# Patient Record
Sex: Female | Born: 1935 | Race: White | Hispanic: No | Smoking: Never smoker
Health system: Southern US, Community
[De-identification: ages and names within clinical notes are randomized; demographics above are authoritative.]

## PROBLEM LIST (undated history)

## (undated) DIAGNOSIS — S42309A Unspecified fracture of shaft of humerus, unspecified arm, initial encounter for closed fracture: Secondary | ICD-10-CM

## (undated) DIAGNOSIS — L729 Follicular cyst of the skin and subcutaneous tissue, unspecified: Secondary | ICD-10-CM

## (undated) DIAGNOSIS — D509 Iron deficiency anemia, unspecified: Secondary | ICD-10-CM

## (undated) DIAGNOSIS — E079 Disorder of thyroid, unspecified: Secondary | ICD-10-CM

## (undated) DIAGNOSIS — M810 Age-related osteoporosis without current pathological fracture: Secondary | ICD-10-CM

## (undated) DIAGNOSIS — E785 Hyperlipidemia, unspecified: Secondary | ICD-10-CM

## (undated) DIAGNOSIS — I350 Nonrheumatic aortic (valve) stenosis: Secondary | ICD-10-CM

## (undated) DIAGNOSIS — I639 Cerebral infarction, unspecified: Secondary | ICD-10-CM

## (undated) DIAGNOSIS — K5790 Diverticulosis of intestine, part unspecified, without perforation or abscess without bleeding: Secondary | ICD-10-CM

## (undated) DIAGNOSIS — G629 Polyneuropathy, unspecified: Secondary | ICD-10-CM

## (undated) DIAGNOSIS — S52539A Colles' fracture of unspecified radius, initial encounter for closed fracture: Secondary | ICD-10-CM

## (undated) DIAGNOSIS — D1800 Hemangioma unspecified site: Secondary | ICD-10-CM

## (undated) DIAGNOSIS — R55 Syncope and collapse: Secondary | ICD-10-CM

## (undated) DIAGNOSIS — M4800 Spinal stenosis, site unspecified: Secondary | ICD-10-CM

## (undated) HISTORY — DX: Cerebral infarction, unspecified: I63.9

## (undated) HISTORY — PX: APPENDECTOMY: SHX54

## (undated) HISTORY — DX: Spinal stenosis, site unspecified: M48.00

## (undated) HISTORY — PX: BREAST SURGERY: SHX581

## (undated) HISTORY — DX: Colles' fracture of unspecified radius, initial encounter for closed fracture: S52.539A

## (undated) HISTORY — DX: Unspecified fracture of shaft of humerus, unspecified arm, initial encounter for closed fracture: S42.309A

## (undated) HISTORY — PX: TOTAL ABDOMINAL HYSTERECTOMY: SHX209

## (undated) HISTORY — DX: Follicular cyst of the skin and subcutaneous tissue, unspecified: L72.9

## (undated) HISTORY — DX: Hemangioma unspecified site: D18.00

## (undated) HISTORY — DX: Nonrheumatic aortic (valve) stenosis: I35.0

## (undated) HISTORY — PX: LUMBAR SPINE SURGERY: SHX701

## (undated) HISTORY — DX: Diverticulosis of intestine, part unspecified, without perforation or abscess without bleeding: K57.90

## (undated) HISTORY — DX: Syncope and collapse: R55

## (undated) HISTORY — PX: CATARACT EXTRACTION: SUR2

## (undated) HISTORY — DX: Iron deficiency anemia, unspecified: D50.9

---

## 1969-09-11 HISTORY — PX: THYROIDECTOMY: SHX17

## 1973-09-11 DIAGNOSIS — I639 Cerebral infarction, unspecified: Secondary | ICD-10-CM

## 1973-09-11 HISTORY — DX: Cerebral infarction, unspecified: I63.9

## 1999-09-12 HISTORY — PX: COLONOSCOPY: SHX174

## 2001-07-23 ENCOUNTER — Ambulatory Visit (HOSPITAL_COMMUNITY): Admission: RE | Admit: 2001-07-23 | Discharge: 2001-07-23 | Payer: Self-pay | Admitting: Ophthalmology

## 2001-08-20 ENCOUNTER — Ambulatory Visit (HOSPITAL_COMMUNITY): Admission: RE | Admit: 2001-08-20 | Discharge: 2001-08-20 | Payer: Self-pay | Admitting: Ophthalmology

## 2001-11-27 ENCOUNTER — Ambulatory Visit (HOSPITAL_COMMUNITY): Admission: RE | Admit: 2001-11-27 | Discharge: 2001-11-27 | Payer: Self-pay | Admitting: Family Medicine

## 2002-08-23 ENCOUNTER — Emergency Department (HOSPITAL_COMMUNITY): Admission: EM | Admit: 2002-08-23 | Discharge: 2002-08-23 | Payer: Self-pay | Admitting: Emergency Medicine

## 2002-12-04 ENCOUNTER — Ambulatory Visit (HOSPITAL_COMMUNITY): Admission: RE | Admit: 2002-12-04 | Discharge: 2002-12-04 | Payer: Self-pay | Admitting: General Surgery

## 2002-12-04 ENCOUNTER — Encounter: Payer: Self-pay | Admitting: General Surgery

## 2003-10-13 ENCOUNTER — Inpatient Hospital Stay (HOSPITAL_COMMUNITY): Admission: EM | Admit: 2003-10-13 | Discharge: 2003-10-17 | Payer: Self-pay | Admitting: Emergency Medicine

## 2004-03-15 ENCOUNTER — Ambulatory Visit (HOSPITAL_COMMUNITY): Admission: RE | Admit: 2004-03-15 | Discharge: 2004-03-15 | Payer: Self-pay | Admitting: Family Medicine

## 2004-05-11 ENCOUNTER — Ambulatory Visit (HOSPITAL_COMMUNITY): Admission: RE | Admit: 2004-05-11 | Discharge: 2004-05-11 | Payer: Self-pay | Admitting: Family Medicine

## 2005-03-21 ENCOUNTER — Ambulatory Visit (HOSPITAL_COMMUNITY): Admission: RE | Admit: 2005-03-21 | Discharge: 2005-03-21 | Payer: Self-pay | Admitting: Family Medicine

## 2005-05-11 ENCOUNTER — Ambulatory Visit (HOSPITAL_COMMUNITY): Admission: RE | Admit: 2005-05-11 | Discharge: 2005-05-11 | Payer: Self-pay | Admitting: Family Medicine

## 2006-03-22 ENCOUNTER — Ambulatory Visit (HOSPITAL_COMMUNITY): Admission: RE | Admit: 2006-03-22 | Discharge: 2006-03-22 | Payer: Self-pay | Admitting: Family Medicine

## 2006-04-18 ENCOUNTER — Ambulatory Visit (HOSPITAL_COMMUNITY): Admission: RE | Admit: 2006-04-18 | Discharge: 2006-04-18 | Payer: Self-pay | Admitting: Family Medicine

## 2006-08-22 ENCOUNTER — Ambulatory Visit (HOSPITAL_COMMUNITY): Admission: RE | Admit: 2006-08-22 | Discharge: 2006-08-22 | Payer: Self-pay | Admitting: Family Medicine

## 2006-10-19 ENCOUNTER — Ambulatory Visit (HOSPITAL_COMMUNITY): Admission: RE | Admit: 2006-10-19 | Discharge: 2006-10-19 | Payer: Self-pay | Admitting: Family Medicine

## 2006-12-07 ENCOUNTER — Ambulatory Visit (HOSPITAL_COMMUNITY): Admission: RE | Admit: 2006-12-07 | Discharge: 2006-12-07 | Payer: Self-pay | Admitting: Family Medicine

## 2007-02-07 ENCOUNTER — Inpatient Hospital Stay (HOSPITAL_COMMUNITY): Admission: RE | Admit: 2007-02-07 | Discharge: 2007-02-09 | Payer: Self-pay | Admitting: Neurological Surgery

## 2007-06-13 ENCOUNTER — Ambulatory Visit (HOSPITAL_COMMUNITY): Admission: RE | Admit: 2007-06-13 | Discharge: 2007-06-13 | Payer: Self-pay | Admitting: Family Medicine

## 2007-08-05 ENCOUNTER — Ambulatory Visit (HOSPITAL_COMMUNITY): Admission: RE | Admit: 2007-08-05 | Discharge: 2007-08-05 | Payer: Self-pay | Admitting: Family Medicine

## 2009-09-29 ENCOUNTER — Encounter: Payer: Self-pay | Admitting: Orthopedic Surgery

## 2009-09-29 ENCOUNTER — Emergency Department (HOSPITAL_COMMUNITY): Admission: EM | Admit: 2009-09-29 | Discharge: 2009-09-29 | Payer: Self-pay | Admitting: Emergency Medicine

## 2009-09-30 ENCOUNTER — Ambulatory Visit: Payer: Self-pay | Admitting: Orthopedic Surgery

## 2009-09-30 ENCOUNTER — Telehealth: Payer: Self-pay | Admitting: Orthopedic Surgery

## 2009-09-30 DIAGNOSIS — I639 Cerebral infarction, unspecified: Secondary | ICD-10-CM | POA: Insufficient documentation

## 2009-09-30 DIAGNOSIS — S42209A Unspecified fracture of upper end of unspecified humerus, initial encounter for closed fracture: Secondary | ICD-10-CM | POA: Insufficient documentation

## 2009-10-04 ENCOUNTER — Ambulatory Visit (HOSPITAL_COMMUNITY): Admission: RE | Admit: 2009-10-04 | Discharge: 2009-10-04 | Payer: Self-pay | Admitting: Orthopedic Surgery

## 2009-10-04 ENCOUNTER — Telehealth: Payer: Self-pay | Admitting: Orthopedic Surgery

## 2009-10-05 ENCOUNTER — Ambulatory Visit: Payer: Self-pay | Admitting: Orthopedic Surgery

## 2009-10-05 ENCOUNTER — Telehealth: Payer: Self-pay | Admitting: Orthopedic Surgery

## 2009-10-06 ENCOUNTER — Telehealth: Payer: Self-pay | Admitting: Orthopedic Surgery

## 2009-10-20 ENCOUNTER — Telehealth: Payer: Self-pay | Admitting: Orthopedic Surgery

## 2009-10-21 ENCOUNTER — Ambulatory Visit: Payer: Self-pay | Admitting: Orthopedic Surgery

## 2009-10-21 DIAGNOSIS — S52539A Colles' fracture of unspecified radius, initial encounter for closed fracture: Secondary | ICD-10-CM | POA: Insufficient documentation

## 2009-10-25 ENCOUNTER — Encounter (HOSPITAL_COMMUNITY): Admission: RE | Admit: 2009-10-25 | Discharge: 2009-11-24 | Payer: Self-pay | Admitting: Orthopedic Surgery

## 2009-10-25 ENCOUNTER — Encounter: Payer: Self-pay | Admitting: Orthopedic Surgery

## 2009-11-01 ENCOUNTER — Encounter: Payer: Self-pay | Admitting: Orthopedic Surgery

## 2009-11-10 ENCOUNTER — Ambulatory Visit: Payer: Self-pay | Admitting: Orthopedic Surgery

## 2009-11-12 ENCOUNTER — Encounter: Payer: Self-pay | Admitting: Orthopedic Surgery

## 2009-11-15 ENCOUNTER — Encounter: Payer: Self-pay | Admitting: Orthopedic Surgery

## 2009-11-17 ENCOUNTER — Encounter: Payer: Self-pay | Admitting: Orthopedic Surgery

## 2009-11-24 ENCOUNTER — Encounter: Payer: Self-pay | Admitting: Orthopedic Surgery

## 2009-11-25 ENCOUNTER — Encounter (HOSPITAL_COMMUNITY): Admission: RE | Admit: 2009-11-25 | Discharge: 2009-12-25 | Payer: Self-pay | Admitting: Orthopedic Surgery

## 2009-12-15 ENCOUNTER — Encounter: Payer: Self-pay | Admitting: Orthopedic Surgery

## 2009-12-17 ENCOUNTER — Ambulatory Visit (HOSPITAL_COMMUNITY)
Admission: RE | Admit: 2009-12-17 | Discharge: 2009-12-17 | Payer: Self-pay | Source: Home / Self Care | Admitting: Family Medicine

## 2009-12-22 ENCOUNTER — Ambulatory Visit: Payer: Self-pay | Admitting: Orthopedic Surgery

## 2010-04-07 ENCOUNTER — Ambulatory Visit (HOSPITAL_COMMUNITY): Admission: RE | Admit: 2010-04-07 | Discharge: 2010-04-07 | Payer: Self-pay | Admitting: Family Medicine

## 2010-10-13 NOTE — Assessment & Plan Note (Signed)
Summary: ct results from ap/frs   Visit Type:  Follow-up Referring Provider:  ER  CC:  right shoulder fracture and right wrist fracture.Marland Kitchen  History of Present Illness: HISTORY   I saw Jaclyn Simpson in the office today for an initial visit.  She is a 77 years old woman with the complaint of:  RIGHT WRIST AND RIGHT SHOULDER pain   On 09-29-09 Fell at home walking up her steps, landed on her arm. She now c/o moderate sharp non radiating pain in the right wrist and severe pain in the right shoulder.   She did try some hydrocodone but it made her sleepy and now she is on tramadol.  ROS: She falls alot, she does not like using a walker, uses walking stick sometimes but still falls.  -xrays done & where: [facility] on [facility]-09. Surgical neck fracture extending into greater tuberosity of right shoulder. Transverse minimally displaced distal right radial metaphyseal fracture with soft tissue swelling.  TODAY:  STILL HAVING PAIN   MEDS: TRAMADOL OR VICODIN   PHYSICAL EXAM NEURO-VASCULAR INTACT   Assessment:  CT SHOWS  Findings:  There is a complex, comminuted fracture involving the right humeral head and neck.  There is a displaced avulsion type injury involving part of the greater tuberosity.  The surgical neck fracture begins at the base of the greater tuberosity and extends obliquely and inferiorly through the humeral neck.  The humeral head is maintained in the glenoid fossa.  The glenoid is intact. There are mild AC joint generative changes.  No scapular or upper rib fractures are seen.  The lesser tuberosity is intact.     Allergies: No Known Drug Allergies   Impression & Recommendations:  Problem # 1:  FRACTURE, HUMERUS, PROXIMAL (ICD-812.00) Assessment Comment Only  The x-rays were done at [facility]. The report and the films have been reviewed.  I agree with the report   I feel that the risk benefit ratio favors non surgical treatment   Orders: Humeral Neck Fx  (16109) Distal Radius Fx (25600)  Medications Added to Medication List This Visit: 1)  Norco 5-325 Mg Tabs (Hydrocodone-acetaminophen) .Marland Kitchen.. 1 by mouth q 4 as needed pain  Patient Instructions: 1)  xrays right shoulder in 2 weeks  Prescriptions: NORCO 5-325 MG TABS (HYDROCODONE-ACETAMINOPHEN) 1 by mouth q 4 as needed pain  #84 x 4   Entered and Authorized by:   Fuller Canada MD   Signed by:   Fuller Canada MD on 10/05/2009   Method used:   Print then Give to Patient   RxID:   [id number]   Appended Document: ct results from ap/frs applied [facility] right wrist

## 2010-10-13 NOTE — Miscellaneous (Signed)
Summary: Rehab Report  Rehab Report   Imported By: Elvera Maria 11/26/2009 11:09:07  _____________________________________________________________________  External Attachment:    Type:   Image     Comment:   pt eval

## 2010-10-13 NOTE — Miscellaneous (Signed)
Summary: Rehab Report  Rehab Report   Imported By: Elvera Maria 11/16/2009 11:45:39  _____________________________________________________________________  External Attachment:    Type:   Image     Comment:   clinic eval ot

## 2010-10-13 NOTE — Assessment & Plan Note (Signed)
Summary: 6 wk reckrt shoulder/wrist/medicare/bankers/bsf   Visit Type:  Follow-up Referring Provider:  ER Primary Provider:  Dr. Milinda Cave  CC:  recheck rt shoulder and wrist.  History of Present Illness: proximal humerus fracture, RIGHT wrist fracture. Both fractures have healed. She went for balance assessment and therapy and did well. She is ambulating with a cane. She can use her RIGHT upper extremity normally.  Forward elevation, RIGHT shoulder 150, passive pronation is full in the form of a RIGHT supination is about 50 full range of motion is noted in the RIGHT elbow. There is no tenderness proximal humerus or distal radius.   followup for RIGHT shoulder and RIGHT wrist fracture January  Medications are Norco 5 mg  she is discharged      Allergies: No Known Drug Allergies   Other Orders: Post-Op Check (59563)  Patient Instructions: 1)  Please schedule a follow-up appointment as needed.

## 2010-10-13 NOTE — Progress Notes (Signed)
Summary: right thumb is swollen  Phone Note Other Incoming   Summary of Call: Sandra/[facility] called about Jaclyn Simpson (2017/06/12)  [place] Spaniel that her right thumb is swollen she can wiggle her fingers and thumb.   There is no swelling above the cast.  She has good capillary refill. She has an appointment to see you tomorrow (10/21/09), but the nurse wanted you to be aware of the swelling. Sandra's # [phone] Initial call taken by: Jacklynn Ganong,  October 20, 2009 11:54 AM

## 2010-10-13 NOTE — Progress Notes (Signed)
Summary: CT appontment scheduled 10/04/09  Phone Note Outgoing Call   Call placed to: Patient Summary of Call: Per Luster Landsberg, patient's appointment for CT scan is scheduled at [facility] for today, 10/04/09, 2:45 registration, 3:15 appointment. Patient's contact, niece, notified ph [phone].  Patient to follow up in our office for results; appointment scheduled. Initial call taken by: Cammie Sickle,  October 04, 2009 2:32 PM

## 2010-10-13 NOTE — Progress Notes (Signed)
Summary: wants order for recliner  Phone Note Other Incoming   Summary of Call: Jaclyn Simpson, Jaclyn Paule' (14-Mar-2017)  neice called to say that Jaclyn Simpson cannot sleep in the bed because she cannot get herself in and out of the bed.  Wants to know if you will write  an order for her to get a remote control recliner to sleep in.  If so, wants the order faxed to [facility] and to call her at [phone] to let her know Initial call taken by: Jacklynn Ganong,  October 05, 2009 2:39 PM  Follow-up for Phone Call        ok  Follow-up by: Fuller Canada MD,  October 05, 2009 2:52 PM  Additional Follow-up for Phone Call Additional follow up Details #1::        Advised of reply and routed to [facility] to write the order and fax to [facility] Additional Follow-up by: Jacklynn Ganong,  October 05, 2009 3:41 PM    New/Updated Medications: * REMOTE CONTROL RECLINER  Prescriptions: REMOTE CONTROL RECLINER   #1 x 0   Entered by:   Ether Griffins   Authorized by:   Fuller Canada MD   Signed by:   Ether Griffins on 10/05/2009   Method used:   Faxed to ...       [facility]* (retail)       [address]       [place], [place]  [address]       Ph: [phone]       Fax: [phone]   RxID:   [id number]

## 2010-10-13 NOTE — Assessment & Plan Note (Signed)
Summary: 2 WK RE-CK/XRAYS RT SHOULDER/FX CARE/MEDICARE/CAF   Visit Type:  Follow-up Referring Provider:  ER Primary Provider:  Dr. Milinda Cave  CC:  frx care shoudler.  History of Present Illness: I saw Jaclyn Simpson in the office today for a followup visit.  She is a 77 years old woman with the complaint of:  DOI: 09/29/09 fell, right shoulder and right wrist fractures.  DAY 22  TREATMENT: Sling right shoulder and right wrist cast.  cast was applied 10/05/09  MEDS: Norco 5.  Has swelling of the feet and arms, is on fluid pills, Dr did not increase her fluid pills when she saw him 10/18/09 her swelling has increased, advised pt and family to call PCP.  Scheduled for:  recheck with xrays right shoulder.  Complaints:  shoulder still aches, wrist does not hurt.    Allergies: No Known Drug Allergies   Impression & Recommendations:  Problem # 1:  FRACTURE, HUMERUS, PROXIMAL (ICD-812.00)  XRAYS: AP lateral, RIGHT shoulder. There is a proximal comminuted fracture of the humerus with apex and posterior angulation however, the fracture fragments are in continuity with less than 45 of angulation. A mini fragment and less than a centimeter of displacement of any fragment.  Impression fracture appears to be healing in stable configuration.  Recommend start physical therapy.  Orders: Post-Op Check [phone]) Shoulder x-ray,  minimum 2 views (60454)  Problem # 2:  COLLES' FRACTURE, RIGHT WRIST (ICD-813.41)  she also has a fracture of her RIGHT wrist has a nondisplaced distal radius fracture. Her RIGHT wrist and hand are swollen side decided to remove the cast and place her in a cockup wrist splint,  He'll have an x-ray at the 6 week mark.  Orders: Post-Op Check (09811)  Patient Instructions: 1)  START PT RIGHT SHOULDER  2)  CAST OFF WRIST AND XRAYS IN 20 DAYS

## 2010-10-13 NOTE — Miscellaneous (Signed)
Summary: Rehabilitation referral form  Rehabilitation referral form   Imported By: Jacklynn Ganong 11/16/2009 09:46:49  _____________________________________________________________________  External Attachment:    Type:   Image     Comment:   External Document

## 2010-10-13 NOTE — Assessment & Plan Note (Signed)
Summary: RE-CK/XRAYS/CAF   Visit Type:  Follow-up Referring Provider:  ER Primary Provider:  Dr. Milinda Cave  CC:  recheck shoulder fx.  History of Present Illness:  followup for RIGHT shoulder and RIGHT wrist fracture January  Patient did well with her shoulder and is undergoing physical therapy complains of some mild elbow pain she is here today for x-rays out of plaster for the RIGHT wrist  Medications are Norco 5 mg  She is ambulatory with a cane  Her wrist has normal alignment  No tenderness  Recommend continued physical therapy.  The physical therapist is advised she needs to balance assessment and we have approved that      Allergies: No Known Drug Allergies   Patient Instructions: 1)  The sling can come off now 2)  Continue therapy for the shoulder with a balance program 3)  come back in 6 weeksw  Appended Document: RE-CK/XRAYS/CAF

## 2010-10-13 NOTE — Progress Notes (Signed)
Summary: Home care order  Phone Note Outgoing Call   Call placed to: Patient Summary of Call: Spoke with patient's niece, POA, Windy Fast, re: request for ADLs as discussed at today's office visit. Faxed order to [facility] and advised patient's niece may not be covered by insurance for ADL's.  Home care nurse will contact at her ph# [phone] or @cell  [phone]. Initial call taken by: Cammie Sickle,  September 30, 2009 4:46 PM

## 2010-10-13 NOTE — Letter (Signed)
Summary: External Other  External Other   Imported By: Elvera Maria 11/26/2009 11:08:32  _____________________________________________________________________  External Attachment:    Type:   Image     Comment:   cert. med [place].

## 2010-10-13 NOTE — Assessment & Plan Note (Signed)
Summary: [facility] ER FOL/UP/FX RT HUMERUS/XR [facility] 09/29/09/MEDICARE/B.LIFE/CAF   Visit Type:  Initial Consult Referring Provider:  ER  CC:  right wrist and shoulder pain .  History of Present Illness: I saw Jaclyn Simpson in the office today for an initial visit.  She is a 77 years old woman with the complaint of:  RIGHT WRIST AND RIGHT SHOULDER pain   On 09-29-09 Fell at home walking up her steps, landed on her arm. She now c/o moderate sharp non radiating pain in the right wrist and severe pain in the right shoulder.   She did try some hydrocodone but it made her sleepy and now she is on tramadol.  ROS: She falls alot, she does not like using a walker, uses walking stick sometimes but still falls.  -xrays done & where: [facility] on [facility]-09. Surgical neck fracture extending into greater tuberosity of right shoulder. Transverse minimally displaced distal right radial metaphyseal fracture with soft tissue swelling.    Allergies (verified): No Known Drug Allergies  Past History:  Past Medical History: Stroke  1975  Past Surgical History: Total Abdominal Hysterectomy Thyroid  Social History: lives with her 21 yo sister   her family is near - by and supportive   Review of Systems Neuro:  Complains of unsteady walking; denies headache, dizziness, migraines, numbness, weakness, and tremor. MS:  Complains of osteoporosis; denies joint pain, rheumatoid arthritis, joint swelling, gout, bone cancer, and . Endo:  Complains of thyroid disease; denies goiter and diabetes. Derm:  Complains of eczema; denies cancer and itching. EENT:  Complains of poor hearing; denies poor vision, cataracts, glaucoma, vertigo, ears ringing, sinusitis, hoarseness, toothaches, and bleeding gums.  The review of systems is negative for General, Cardiac , Resp, GI, GU, Psych, Immunology, and Lymphatic.  Physical Exam  Msk:  The patient is well developed and nourished, with normal grooming and hygiene. The body  habitus is  small Pulses:  pulses normal in all 4 extremities Extremities:  LOWER EXTREMS: Normal major alignment and no atrophy, subluxation or tremor or contractures  The left Upper extremity: has  normal appearance, ROM, strength and stability.  The right Upper extremity is ecchymotic and tender over the proximal humerus and the distal radius.  ROM is limited by pain   Motor function is normal   The shoulder, elbow and wrist are stable     Neurologic:  no focal deficits, CN II-XII grossly intact with normal reflexes, coordination, muscle strength and tone Skin:  intact without lesions or rashes Cervical Nodes:  no significant adenopathy Psych:  alert and cooperative; normal mood and affect; normal attention span and concentration   Impression & Recommendations:  Problem # 1:  FRACTURE, HUMERUS, PROXIMAL (ICD-812.00) Assessment New The x-rays were done at [facility]. The report and the films have been reviewed. Agee with the report [3 pt fracture proximal humerus   I took additional films in the office to better delineate the fracture but I still could not see it well enough to make a decision.  REC CT SCAN w/ 3 D recon  Orders: Home Health Referral (Home Health) New Patient Level IV (95621) Shoulder x-ray,  minimum 2 views (30865)  Patient Instructions: 1)  CT with 3D recon right shoulder ASAP, return with CT  2)  Home health aid   Appended Document: [facility] ER FOL/UP/FX RT HUMERUS/XR [facility] 09/29/09/MEDICARE/B.LIFE/CAF current meds are Fexofenadine, Tramadol, lasix, omeprazole, V C forte , Synthroid, Gabapentin, Calcium 500 plus d, Simvastatin.

## 2010-10-13 NOTE — Miscellaneous (Signed)
Summary: PT Discharge summary  PT Discharge summary   Imported By: Jacklynn Ganong 12/28/2009 08:54:31  _____________________________________________________________________  External Attachment:    Type:   Image     Comment:   External Document

## 2010-10-13 NOTE — Letter (Signed)
Summary: Letter of medical necessity [facility]  Letter of medical necessity [facility]   Imported By: Cammie Sickle 05/05/2010 16:01:24  _____________________________________________________________________  External Attachment:    Type:   Image     Comment:   External Document

## 2010-10-13 NOTE — Miscellaneous (Signed)
Summary: OT clinical evaluation  OT clinical evaluation   Imported By: Jacklynn Ganong 11/16/2009 09:45:23  _____________________________________________________________________  External Attachment:    Type:   Image     Comment:   External Document

## 2010-10-13 NOTE — Letter (Signed)
Summary: History form  History form   Imported By: Jacklynn Ganong 10/07/2009 15:57:49  _____________________________________________________________________  External Attachment:    Type:   Image     Comment:   External Document

## 2010-10-13 NOTE — Miscellaneous (Signed)
Summary: pt order for aph  Clinical Lists Changes  Orders: Added new Referral order of Physical Therapy Referral (PT) - Signed

## 2010-10-13 NOTE — Miscellaneous (Signed)
Summary: OT progress note  OT progress note   Imported By: Jacklynn Ganong 11/16/2009 09:45:59  _____________________________________________________________________  External Attachment:    Type:   Image     Comment:   External Document

## 2010-10-13 NOTE — Miscellaneous (Signed)
Summary: PT refereral form  PT refereral form   Imported By: Jacklynn Ganong 11/05/2009 09:33:02  _____________________________________________________________________  External Attachment:    Type:   Image     Comment:   External Document

## 2010-10-13 NOTE — Progress Notes (Signed)
Summary: Notes faxed to PCP  Phone Note Outgoing Call   Summary of Call: On 10/05/09, faxed patient's notes and CT report to primary care physician, Dr Earley Favor, Jonita Albee, per patient request.  Authorization attached with records; sent to fax # [phone] Initial call taken by: Cammie Sickle,  October 06, 2009 5:13 PM

## 2010-12-05 ENCOUNTER — Other Ambulatory Visit: Payer: Self-pay | Admitting: Family Medicine

## 2010-12-05 NOTE — Telephone Encounter (Signed)
Pt seen at [facility].  No longer being seen by Dr. Milinda Cave.

## 2010-12-09 ENCOUNTER — Other Ambulatory Visit: Payer: Self-pay | Admitting: Family Medicine

## 2010-12-14 ENCOUNTER — Other Ambulatory Visit: Payer: Self-pay | Admitting: Family Medicine

## 2010-12-14 NOTE — Telephone Encounter (Signed)
Is it OK to fill these meds?  Please advise.

## 2010-12-14 NOTE — Telephone Encounter (Signed)
She was a patient of mine in [place], but not here.  So, unless she has an appt to establish care with me already set up, then this RF request needs to be rerouted to [facility] in [place].  Thanks--PM

## 2010-12-15 ENCOUNTER — Other Ambulatory Visit: Payer: Self-pay | Admitting: Family Medicine

## 2010-12-19 NOTE — Telephone Encounter (Signed)
Copy of this note faxed to [facility].

## 2010-12-19 NOTE — Telephone Encounter (Signed)
Pt seen in [place].  No longer being seen by Dr. Milinda Cave.

## 2010-12-20 ENCOUNTER — Other Ambulatory Visit: Payer: Self-pay | Admitting: Family Medicine

## 2010-12-27 ENCOUNTER — Other Ambulatory Visit (HOSPITAL_COMMUNITY): Payer: Self-pay

## 2010-12-29 ENCOUNTER — Ambulatory Visit (HOSPITAL_COMMUNITY)
Admission: RE | Admit: 2010-12-29 | Discharge: 2010-12-29 | Disposition: A | Discharge status: Home / Self Care | Payer: Medicare Other | Source: Ambulatory Visit | Attending: Pediatrics | Admitting: Pediatrics

## 2010-12-29 DIAGNOSIS — Z8249 Family history of ischemic heart disease and other diseases of the circulatory system: Secondary | ICD-10-CM | POA: Insufficient documentation

## 2010-12-29 DIAGNOSIS — R55 Syncope and collapse: Secondary | ICD-10-CM | POA: Insufficient documentation

## 2010-12-29 DIAGNOSIS — I359 Nonrheumatic aortic valve disorder, unspecified: Secondary | ICD-10-CM

## 2010-12-29 DIAGNOSIS — I10 Essential (primary) hypertension: Secondary | ICD-10-CM | POA: Insufficient documentation

## 2011-01-03 ENCOUNTER — Encounter: Payer: Self-pay | Admitting: Cardiology

## 2011-01-03 ENCOUNTER — Ambulatory Visit (INDEPENDENT_AMBULATORY_CARE_PROVIDER_SITE_OTHER): Payer: Medicare Other | Admitting: Cardiology

## 2011-01-03 VITALS — BP 114/74 | HR 58 | Ht 62.0 in | Wt 133.0 lb

## 2011-01-03 DIAGNOSIS — I359 Nonrheumatic aortic valve disorder, unspecified: Secondary | ICD-10-CM

## 2011-01-03 DIAGNOSIS — I6789 Other cerebrovascular disease: Secondary | ICD-10-CM

## 2011-01-03 DIAGNOSIS — S52539A Colles' fracture of unspecified radius, initial encounter for closed fracture: Secondary | ICD-10-CM

## 2011-01-03 DIAGNOSIS — R55 Syncope and collapse: Secondary | ICD-10-CM

## 2011-01-03 DIAGNOSIS — I350 Nonrheumatic aortic (valve) stenosis: Secondary | ICD-10-CM

## 2011-01-03 NOTE — Patient Instructions (Signed)
Your physician recommends that you schedule a follow-up appointment in: 6 months CALL FOR RECURRENT SYMPTOMS

## 2011-01-03 NOTE — Assessment & Plan Note (Signed)
Aortic stenosis is mild based upon her recent echocardiogram and her examination.  I suspect it will be at least 10 years before there is likely to be adequate progression to cause symptoms.

## 2011-01-03 NOTE — Assessment & Plan Note (Signed)
By history, it is not entirely clear that syncope has occurred.  She has never had premonitory symptoms and appears to tolerate the standing position better than sitting or lying.  I doubt that she is suffering transient cerebral perfusion problems, but will plan to monitor vital signs, activities and symptoms in the future.

## 2011-01-03 NOTE — Assessment & Plan Note (Signed)
Patient has suffered multiple fractures secondary to falls.  She has evidence for peripheral neuropathy by exam and is said to have vitamin B12 deficiency as well.  Evaluation by a neurologist might be helpful in further defining her neurologic condition, although I think it unlikely that a specific agent to improve this situation will be identified.

## 2011-01-03 NOTE — Progress Notes (Signed)
HPI:  Jaclyn Simpson is a very pleasant nearly 77 year old woman with a recently detected murmur of aortic stenosis and a number of questionable episodes of impaired consciousness.  This nice woman has enjoyed generally excellent health without prior evaluations by a cardiologist.  She has never been told of a heart murmur in the past.  Exercise tolerance has been good without dyspnea or chest discomfort.  She recently has experienced 2 spells.  On one occasion, she was in the bathroom at night and either lost consciousness or fell asleep while sitting on the toilet.  She awakened perhaps an hour later with paresthesias of her legs and fell when she tried to stand up.  Eventually, normal sensation and function return to her legs.  On the second occasion, she was sitting on the side of her bed and subsequently awakened to find herself lying straight back on the bed.  She reports snoring at night and difficulty sleeping with daytime somnolence and daytime napping.    Patient has unsteady station and gait with multiple falls.  She is quite clear that none of these involve loss of consciousness.  She denies lightheadedness or vertigo.  She has been evaluated by physical therapy and treated without benefit.  She has a walker at home, but rarely uses it.  Appropriate equipment has been installed in her bathrooms at home and all items removed that represented a tripping hazard.  Recent laboratory studies obtained and reviewed.  These included a normal chemistry profile except for her a fasting blood glucose of 102 and a normal CBC.  X-ray of the right thigh showed abnormalities consistent with a seroma or hematoma.  ABIs in 2008 were normal.  Carotid ultrasound was normal in 2006.  Cholelithiasis identified by abdominal ultrasound in 03/2010.  Aorta showed calcifications in the wall but no aneurysm.  Current Outpatient Prescriptions on File Prior to Visit  Medication Sig Dispense Refill  . HYDROcodone-acetaminophen  (NORCO) 5-325 MG per tablet Take 1 tablet by mouth every 6 (six) hours as needed.          No Known Allergies    Past Medical History  Diagnosis Date  . Stroke 1975  . Colles' fracture     right wrist  . Humerus fracture     proximal  . Kyphosis   . Syncope     Echo in 12/2010-mild to moderate LVH; normal EF; mild AS and AI     Past Surgical History  Procedure Date  . Total abdominal hysterectomy      No family history on file.   History   Social History  . Marital Status: Widowed    Spouse Name: N/A    Number of Children: N/A  . Years of Education: N/A   Occupational History  . Not on file.   Social History Main Topics  . Smoking status: Never Smoker   . Smokeless tobacco: Never Used  . Alcohol Use: No  . Drug Use: No  . Sexually Active: Not on file   Other Topics Concern  . Not on file   Social History Narrative  . No narrative on file     ROS: Requires corrective lenses following bilateral cataract extractions; notes significant hearing loss; upper and lower partial dentures; arthritic discomfort, particularly in the shoulders; intermittent pedal edema.  All other systems reviewed and are negative.  PHYSICAL EXAM: BP 114/74  Pulse 58  Ht 5\' 2"  (1.575 m)  Wt 133 lb (60.328 kg)  BMI 24.33 kg/m2  SpO2 91%  General-Well-developed; no acute distress; older woman with excellent mentation but limited mobility HEENT-[place]/AT; PERRL; EOM intact; conjunctiva and lids nl; circumflex ocular erythema Neck-No JVD; bilateral carotid bruits vs.transmitted murmur; ectatic right carotid with a visible pulsation at the base of the neck Endocrine-No thyromegaly Lungs-Mild kyphosis; no tachypnea, clear without rales, rhonchi or wheezes Cardiovascular- normal PMI; normal S1; preserved S2; grade 2/6 mid peaking systolic ejection murmur at the cardiac base Abdomen-BS normal; soft and non-tender without masses or organomegaly Musculoskeletal-No deformities, cyanosis or  clubbing Neurologic-Nl cranial nerves; symmetric strength and tone Skin- Warm, multiple skin tags and pigmented nevi Extremities-Nl distal pulses; 1+ edema  EKG:   Sinus bradycardia at a rate of 59 bpm; nondiagnostic inferior Q waves; minor nonspecific T wave abnormality.  No previous tracing for comparison.  ASSESSMENT AND PLAN:

## 2011-01-14 ENCOUNTER — Other Ambulatory Visit: Payer: Self-pay | Admitting: Family Medicine

## 2011-01-16 ENCOUNTER — Other Ambulatory Visit: Payer: Self-pay | Admitting: Family Medicine

## 2011-01-17 ENCOUNTER — Other Ambulatory Visit: Payer: Self-pay | Admitting: Family Medicine

## 2011-01-24 NOTE — Op Note (Signed)
Jaclyn Simpson, Jaclyn Simpson              ACCOUNT NO.:  [id number]   MEDICAL RECORD NO.:  [id number]          PATIENT TYPE:  INP   LOCATION:  2899                         FACILITY:  [facility]   PHYSICIAN:  Stefani Dama, M.D.  DATE OF BIRTH:  May 31, 1917   DATE OF PROCEDURE:  02/07/2007  DATE OF DISCHARGE:                               OPERATIVE REPORT   PREOPERATIVE DIAGNOSIS:  Lumbar stenosis L5-S1 with radiculopathy.   POSTOPERATIVE DIAGNOSIS:  Lumbar stenosis L5-S1 with radiculopathy.   PROCEDURES:  Laminectomy L5 with decompression of L5 and S1 nerve roots  using operating microscope and microdissection technique.   SURGEON:  Dr. Barnett Abu.   FIRST ASSISTANT:  Dr. Colon Branch.   ANESTHESIA:  General endotracheal.   INDICATIONS:  Jaclyn Simpson is an 77 year old individual who has had  significant back and primarily right lower extremity pain that has  gotten severely worse over the past number of months.  She had a number  of falls at the home place and was noted to have severe spondylitic  stenosis at the L5-S1 level on the MRI recently performed demonstrating  that she had right lower extremity weakness.  It was noted that she has  severe stenosis and was advised regarding surgical decompression.  She  is taken to the operating room for this procedure.   PROCEDURE:  The patient was brought to the operating room supine on the  stretcher.  After smooth induction of general endotracheal anesthesia  she was placed in the prone position.  The back was prepped with  DuraPrep and draped in sterile fashion.  Midline incision was created  and carried down to lumbodorsal fascia which was opened on either side  of midline to expose the spinous processes of L5 and S1.  The  interlaminar space at L5-S1 was also exposed.  Self-retaining retractor  was placed deep in the wound and the lamina of L5 was identified  positively on a radiograph.  With this then laminectomy of L5 was  undertaken using a Leksell rongeur to remove the spinous processes and  laminar arch.  Laminar arch was noted be very thickened and  hypertrophied and for this purpose a 5 mm round bit was used on a high-  speed drill to thin the edges of the lamina until the central canal was  exposed.  Then a 2 mm Kerrison punch was used to carefully pick out  laminar edges and then complete the laminectomy central.  Dissection was  carried out laterally.  At the L5-S1 junction there was noted to be a  substantial overgrowth of yellow ligament and hypertrophied synovium  from the facette joint, particularly worse on the left side than on the  right.  This dissection was performed carefully and the inferior margin  of facette joint was removed so as to prevent any bone on bone intrusion  as the patient stood up and extended.  On the left side the facette  joint was noted to be severely loosened already and as we removed the  inferior portion of the facette joint approximately half of the facette  joint came  free.  The superior portion of facette joint however was  allowed to remain intact.  The dissection was carried out laterally to  expose the takeoff the L5 nerve root which was noted be somewhat  compressed in the lateral recess.  This was released and ultimately a  hockey stick dissector could be passed out the path of the L5 nerve  root.  The S1 nerve root was similarly decompressed medially and  inferiorly.  In the end hemostasis in the soft tissues obtained  meticulously.  The common dural tube and L5-S1 nerve roots were well  decompressed and the wound was irrigated copiously with antibiotic  irrigating solution.  Care was taken to make sure that the facette  joints remained intact with their gliding surfaces apposed and with this  then the lumbodorsal fascia was closed with #1 Vicryl interrupted  fashion, 2-0 Vicryl used in subcutaneous tissues, 3-0 Vicryl  subcuticularly.  Dry sterile dressing  was placed on the wound.  The  patient was then returned to recovery room in stable condition.  It  should be noted that total of 30 mL of lidocaine mixed with Marcaine was  injected into the paraspinous tissues and spaces to provide local  anesthesia during the postoperative period.      Stefani Dama, M.D.  Electronically Signed     HJE/MEDQ  D:  02/07/2007  T:  02/07/2007  Job:  [id number]

## 2011-01-27 NOTE — H&P (Signed)
NAME:  Jaclyn Simpson, Jaclyn Simpson                        ACCOUNT NO.:  [id number]   MEDICAL RECORD NO.:  [id number]                   PATIENT TYPE:  INP   LOCATION:  A338                                 FACILITY:  [facility]   PHYSICIAN:  Vania Rea, M.D.              DATE OF BIRTH:  10/25/1916   DATE OF ADMISSION:  10/13/2003  DATE OF DISCHARGE:                                HISTORY & PHYSICAL   PRIMARY CARE PHYSICIAN:  Dr. Ceasar Mons in [facility], phone number  [phone].   CHIEF COMPLAINT:  Muscle cramps and difficulty walking progressing over  three days.   HPI:  This is an 77 year old Caucasian lady with a history of hypothyroidism  and chronic leg edema, on Lasix, who has been having progressive leg cramps  for the past three days.  It has reached the stage now where she has  difficulty walking.   She denies fever, cough or cold.  She denies nausea, vomiting or diarrhea.  She takes Lasix 8 mg regularly but no potassium supplement.   Patient also has osteoporosis and takes Os-Cal and Fosamax regularly.   In the emergency room patient received Toradol and Vicodin but has obtained  no relief and has difficulty ambulating with assistance.   PAST MEDICAL HISTORY:  1. Osteoporosis.  2. Chronic leg edema.  3. 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  4. History of hypothyroidism.   MEDICATIONS:  1. Lasix 40-80 mg daily.  2. Aspirin 81 mg daily.  3. Os-Cal 500 mg.  4. Fosamax 70 mg q.Saturday.  5. Voltaren 50 mg three times daily.  6. Synthroid 50 mcg daily.  7. Rynatan twice daily p.r.n.  8. Meclizine 50 mg twice daily p.r.n.  9. MiraLax p.r.n.   ALLERGIES:  NO KNOWN DRUG ALLERGIES.   SOCIAL HISTORY:  Denies tobacco, alcohol or drug use.  She is widowed with  no children.   FAMILY HISTORY:  Her father died of an MI at age 70.  Her mother died of a  stroke at age 36.  She had a brother who died of colon cancer in his 74s and  a sister with a brain tumor  at age 34.  She is a retired Psychiatric nurse and  is usually able to ambulate without any difficulty.   REVIEW OF SYSTEMS:  No further contributory.   PHYSICAL EXAM:  A pleasant, elderly female who seems to have a sensorineural  hearing loss, is not wearing her hearing aid currently.  Her temperature is  98.0, blood pressure is 117/51, pulse 65, respiration 20, saturation 98% on  room air.  HEENT:  She is pink, she is anicteric.  She is not dehydrated.  Pupils equal  and reactive to light.  CHEST:  She has kyphosis.  She is clear to auscultation bilaterally.  CARDIOVASCULAR SYSTEM:  Regular rhythm, no murmurs.  ABDOMEN:  Soft and nontender.  EXTREMITIES:  Trace  edema bilaterally.  3+ pulses bilaterally.  CNS:  She is alert and oriented x3.  She has decreased hearing.  Cranial  nerves grossly intact.  No facial weakness.  Power is grade 4 to 5  bilaterally with mild weakness on the left but not marked.   LABS:  Significant for a sodium of 133, a potassium of 3.4, creatinine of  1.2.  Otherwise, essentially normal.   ASSESSMENT:  Elderly lady on Lasix with no potassium supplement who presents  with mild hypokalemia and leg cramps.   PLAN:  1. Will check a magnesium.  2. Will give her magnesium and potassium supplements.  3. Will admit her for observation.  4. Get PT/OT eval.  5. Check her thyroid function test.   We expect that she will recover spontaneously with replacement of minerals.     ___________________________________________                                         Vania Rea, M.D.   LC/MEDQ  D:  10/13/2003  T:  10/13/2003  Job:  [id number]   cc:   Fenton Malling. Jenean Lindau, M.D.  [address]  [place], [place] [address]  [phone]

## 2011-01-27 NOTE — Discharge Summary (Signed)
NAME:  Jaclyn Simpson, Jaclyn Simpson                        ACCOUNT NO.:  [id number]   MEDICAL RECORD NO.:  [id number]                   PATIENT TYPE:  INP   LOCATION:  A338                                 FACILITY:  [facility]   PHYSICIAN:  Vania Rea, M.D.              DATE OF BIRTH:  February 12, 1917   DATE OF ADMISSION:  10/13/2003  DATE OF DISCHARGE:  10/17/2003                                 DISCHARGE SUMMARY   INCOMPLETE (DISCHARGE SUMMARY CANCELLED AS PREVIOUSLY DONE ON FEBRUARY 5TH  PER DICTATING M.D.)   PRIMARY CARE PHYSICIAN:  Ceasar Mons, M.D.-[facility].  The patient  indicates the desire to change physician to Jeoffrey Massed, M.D.   DISCHARGE DIAGNOSES:  1. Severe bilateral cramps, improving.  2. History of hypothyroidism.  3. History of osteoporosis.  4. History of chronic leg edema.  5. Status post cerebrovascular accident in 1975 with mild left hemiplegia.  6. Newly diagnosed spinal stenosis.   DISPOSITION:  Discharged to [facility] under the care of Jeoffrey Massed, M.D.     ___________________________________________                                         Vania Rea, M.D.   LC/MEDQ  D:  10/22/2003  T:  10/23/2003  Job:  [id number]

## 2011-01-27 NOTE — Consult Note (Signed)
NAME:  Jaclyn Simpson, Jaclyn Simpson                        ACCOUNT NO.:  [id number]   MEDICAL RECORD NO.:  [id number]                   PATIENT TYPE:  INP   LOCATION:  A338                                 FACILITY:  [facility]   PHYSICIAN:  J. Darreld Mclean, M.D.              DATE OF BIRTH:  1917-05-15   DATE OF CONSULTATION:  DATE OF DISCHARGE:                                   CONSULTATION   CONSULTING PHYSICIAN:  J. Darreld Mclean, M.D.   REQUESTING PHYSICIAN:  Vania Rea, M.D.   CHIEF COMPLAINT:  My legs hurt.   The patient has been having a 3-4 day history of pain and cramping in her  legs.  She does not know the cause.  She has not done anything different.  I  have reviewed the chart, and I have reviewed her x-rays __________ reference  reports.   She has a history of an old CVA in 70 and 1976 with left side involvement.  Since then, the only residual she has is some chronic pain and burning at  times in the left arm, left side and her left leg.  She says it is no worse,  no different than it has been.  She tried to get up and walk a few days ago  and started having pain in both legs, cramping and it got progressively  worse to the point where she could not ambulate.  She says she has not  changed her diet.  She has not lifted anything heavy.  She has not sat down  hard.   PHYSICAL EXAMINATION:  MUSCULOSKELETAL:  Clinically, her reflexes are  normal.  Straight leg raising is negative bilaterally.  She has some pain in  the lower back but no spasms.  Neurovascular intact.  Sensation appears to  be equal in both lower extremities while laying in bed.  I did not ask her  to stand, she says she would feel uncomfortable in doing that.  Passive  range of motion of the hips, and knees, and ankles is normal.  Pulses are  good.   I have reviewed the x-rays negative except there are questionable changes at  T11, questionable old compression fracture.   IMPRESSION:  Leg pain, cramps, low  back pain.   PLAN:  MRI.  Continue the bed rest.  Attempt to get her out of bed as  tolerated.  Will follow with you.  We will see what the MRI shows.      ___________________________________________                                            Teola Bradley, M.D.   JWK/MEDQ  D:  10/14/2003  T:  10/15/2003  Job:  [id number]

## 2011-05-22 ENCOUNTER — Other Ambulatory Visit: Payer: Self-pay | Admitting: Family Medicine

## 2011-07-21 ENCOUNTER — Other Ambulatory Visit: Payer: Self-pay | Admitting: Cardiology

## 2011-07-21 ENCOUNTER — Encounter: Payer: Self-pay | Admitting: Adult Health

## 2011-07-21 ENCOUNTER — Other Ambulatory Visit: Payer: Self-pay

## 2011-07-21 ENCOUNTER — Ambulatory Visit (INDEPENDENT_AMBULATORY_CARE_PROVIDER_SITE_OTHER): Payer: Medicare Other | Admitting: Adult Health

## 2011-07-21 DIAGNOSIS — I350 Nonrheumatic aortic (valve) stenosis: Secondary | ICD-10-CM

## 2011-07-21 DIAGNOSIS — R609 Edema, unspecified: Secondary | ICD-10-CM

## 2011-07-21 DIAGNOSIS — R600 Localized edema: Secondary | ICD-10-CM

## 2011-07-21 DIAGNOSIS — I359 Nonrheumatic aortic valve disorder, unspecified: Secondary | ICD-10-CM

## 2011-07-21 DIAGNOSIS — R55 Syncope and collapse: Secondary | ICD-10-CM

## 2011-07-21 NOTE — Progress Notes (Signed)
Addended by: Demetrios Loll on: 07/21/2011 04:43 PM   Modules accepted: Orders

## 2011-07-21 NOTE — Assessment & Plan Note (Signed)
She is without complaint of increased shortness of breath, chest pain or weakness.  She is compliant with her medications.  Will make no changes in this nice lady.  She will follow-up in 6 months unless symptomatic.

## 2011-07-21 NOTE — Assessment & Plan Note (Signed)
She is having some pain in the left leg/calf area with walking along with bilateral LEE. She takes lasix daily for this, but finds that the swelling is no better at times. She tries to keep her legs elevated as much as possible. I discussed having ultrasound of lower extremities to r/o DVT with the pain she is experiencing. Her granddaughter thinks this may be a good idea. She is agreeable to have this checked. However, she would not be a coumadin candidate with her frequent falls. May add ASA if necessary. Will follow,

## 2011-07-21 NOTE — Progress Notes (Signed)
HPI:  Jaclyn Simpson is a very pleasant 77 y/o patient of Dr. Dietrich Pates we are seeing on 6 months follow-up for ongoing assessment and treatment of AoV stenosis, with history of CVA, frequent falls, (refuses a walker "it gets in the way")and arthritis.  She comes today with complaints of LEE R>L which has been chronic for her, but more worrisome over the last few weeks.  She has some soreness in her leg calf with the swelling as well. She was given some sort of cream by her PCP for pain, but she states this does not help.    No Known Allergies  Current Outpatient Prescriptions  Medication Sig Dispense Refill  . alendronate (FOSAMAX) 70 MG tablet Take 70 mg by mouth every 7 (seven) days. Take with a full glass of water on an empty stomach.       . Calcium Carbonate-Vitamin D (CALCIUM-VITAMIN D) 500-200 MG-UNIT per tablet Take 1 tablet by mouth 2 (two) times daily with a meal.        . furosemide (LASIX) 40 MG tablet Take 40 mg by mouth daily.        Marland Kitchen gabapentin (NEURONTIN) 300 MG capsule Take 300 mg by mouth 2 (two) times daily.        Marland Kitchen levothyroxine (SYNTHROID, LEVOTHROID) 50 MCG tablet Take 50 mcg by mouth daily.        . Multiple Vitamins-Minerals (V-C FORTE PO) Take 1 tablet by mouth daily.        Marland Kitchen omeprazole (PRILOSEC) 20 MG capsule Take 20 mg by mouth daily.        . Polyethylene Glycol 3350 GRAN by Does not apply route as needed.        . simvastatin (ZOCOR) 20 MG tablet Take 20 mg by mouth at bedtime.        . traMADol-acetaminophen (ULTRACET) 37.5-325 MG per tablet Take 1 tablet by mouth every 6 (six) hours as needed.          Past Medical History  Diagnosis Date  . Stroke 1975  . Colles' fracture     right wrist  . Humerus fracture     proximal  . Syncope     Echo in 12/2010-mild to moderate LVH; normal EF; mild AS and AI  . Aortic valve stenosis     Past Surgical History  Procedure Date  . Total abdominal hysterectomy   . Thyroid surgery   . Lumbar spine surgery   .  Colonoscopy 2001    RUE:AVWUJW of systems complete and found to be negative unless listed above PHYSICAL EXAM BP 124/63  Pulse 61  Ht 5' (1.524 m)  Wt 131 lb (59.421 kg)  BMI 25.58 kg/m2  SpO2 94%  General: Well developed, well nourished, in no acute distress Head: Eyes PERRLA, No xanthomas.   Normal cephalic and atramatic  Lungs: Clear bilaterally to auscultation and percussion. Heart: HRRR S1 S2, 2/6 systolic murmur with radiation to the carotids..Pulsitile carotid pulse on the right. Pulses are 2+ & equal.         . No JVD.  No abdominal bruits. No femoral bruits. Abdomen: Bowel sounds are positive, abdomen soft and non-tender without masses or                  Hernia's noted. Msk:  Back kyphosis, with slow  gait. Diminihsed  strength and tone for age. Uses can for ambulation. Extremities: No clubbing, cyanosis or edema.  DP +1 Neuro: Alert and oriented X  3. Psych:  Good affect, responds appropriately  EKG: Sinus bradycardia rate of 59 bpm.  LAFB. Inferior ST-Twave abnormalities.:  ASSESSMENT AND PLAN

## 2011-07-21 NOTE — Patient Instructions (Signed)
**Note De-Identified  Obfuscation** Your physician recommends that you continue on your current medications as directed. Please refer to the Current Medication list given to you today.  Your physician has requested that you have a lower or upper extremity venous duplex. This test is an ultrasound of the veins in the legs or arms. It looks at venous blood flow that carries blood from the heart to the legs or arms. Allow one hour for a Lower Venous exam. Allow thirty minutes for an Upper Venous exam. There are no restrictions or special instructions.   Your physician recommends that you schedule a follow-up appointment in: 6 months

## 2011-07-26 ENCOUNTER — Ambulatory Visit (HOSPITAL_COMMUNITY)
Admission: RE | Admit: 2011-07-26 | Discharge: 2011-07-26 | Disposition: A | Discharge status: Home / Self Care | Payer: Medicare Other | Source: Ambulatory Visit | Attending: Cardiology | Admitting: Cardiology

## 2011-07-26 DIAGNOSIS — M7989 Other specified soft tissue disorders: Secondary | ICD-10-CM | POA: Insufficient documentation

## 2011-07-26 DIAGNOSIS — M79609 Pain in unspecified limb: Secondary | ICD-10-CM | POA: Insufficient documentation

## 2011-07-26 DIAGNOSIS — R609 Edema, unspecified: Secondary | ICD-10-CM

## 2011-07-27 ENCOUNTER — Telehealth: Payer: Self-pay | Admitting: *Deleted

## 2011-07-27 NOTE — Telephone Encounter (Signed)
Patient notified of ultrasound results ?

## 2011-08-01 ENCOUNTER — Encounter: Payer: Self-pay | Admitting: Cardiology

## 2011-08-01 DIAGNOSIS — M4800 Spinal stenosis, site unspecified: Secondary | ICD-10-CM | POA: Insufficient documentation

## 2011-08-01 DIAGNOSIS — Z0189 Encounter for other specified special examinations: Secondary | ICD-10-CM | POA: Insufficient documentation

## 2011-08-01 DIAGNOSIS — N189 Chronic kidney disease, unspecified: Secondary | ICD-10-CM | POA: Insufficient documentation

## 2011-08-01 DIAGNOSIS — E039 Hypothyroidism, unspecified: Secondary | ICD-10-CM | POA: Insufficient documentation

## 2011-08-01 DIAGNOSIS — M810 Age-related osteoporosis without current pathological fracture: Secondary | ICD-10-CM | POA: Insufficient documentation

## 2011-08-01 DIAGNOSIS — K219 Gastro-esophageal reflux disease without esophagitis: Secondary | ICD-10-CM | POA: Insufficient documentation

## 2011-08-01 DIAGNOSIS — E785 Hyperlipidemia, unspecified: Secondary | ICD-10-CM | POA: Insufficient documentation

## 2012-01-22 ENCOUNTER — Encounter: Payer: Self-pay | Admitting: Adult Health

## 2012-01-22 ENCOUNTER — Ambulatory Visit (INDEPENDENT_AMBULATORY_CARE_PROVIDER_SITE_OTHER): Payer: Medicare Other | Admitting: Adult Health

## 2012-01-22 VITALS — BP 114/64 | HR 66 | Resp 16 | Ht 62.0 in | Wt 130.0 lb

## 2012-01-22 DIAGNOSIS — I350 Nonrheumatic aortic (valve) stenosis: Secondary | ICD-10-CM

## 2012-01-22 DIAGNOSIS — I359 Nonrheumatic aortic valve disorder, unspecified: Secondary | ICD-10-CM

## 2012-01-22 DIAGNOSIS — R609 Edema, unspecified: Secondary | ICD-10-CM

## 2012-01-22 DIAGNOSIS — R600 Localized edema: Secondary | ICD-10-CM

## 2012-01-22 NOTE — Progress Notes (Signed)
HPI:  Jaclyn Simpson is a very pleasant 78 y/o patient of Dr. Dietrich Pates we are seeing on 6 months follow-up for ongoing assessment and treatment of AoV stenosis, with history of CVA, frequent falls, (refuses a walker "it gets in the way")and arthritis.  She comes today with complaints of LEE R>L which has been chronic for her, but more worrisome over the last few weeks.  She has some soreness in her leg calf with the swelling as well. She was given some sort of cream by her PCP for pain, but she states this does not help.  I had a doppler ultrasound completed to evaluate for DVT. This was found to be negative. Continued medical management. She has had no complaints of chest pain, DOE or dizziness. She remains active within her energy level.Labs are followed by Dr. Stephania Fragmin.  No Known Allergies  Current Outpatient Prescriptions  Medication Sig Dispense Refill  . alendronate (FOSAMAX) 70 MG tablet Take 70 mg by mouth every 7 (seven) days. Take with a full glass of water on an empty stomach.       . Calcium Carbonate-Vitamin D (CALCIUM-VITAMIN D) 500-200 MG-UNIT per tablet Take 1 tablet by mouth 2 (two) times daily with a meal.        . Chlorphen Tan-Phenyleph Tan (R-TANNA PO) Take by mouth daily.      . fluvastatin XL (LESCOL XL) 80 MG 24 hr tablet Take 80 mg by mouth daily.      . furosemide (LASIX) 40 MG tablet Take 20 mg by mouth daily.       Marland Kitchen gabapentin (NEURONTIN) 300 MG capsule Take 300 mg by mouth 2 (two) times daily.        Marland Kitchen levothyroxine (SYNTHROID, LEVOTHROID) 50 MCG tablet Take 50 mcg by mouth daily.        . Multiple Vitamins-Minerals (V-C FORTE PO) Take 1 tablet by mouth daily.        Marland Kitchen omeprazole (PRILOSEC) 20 MG capsule Take 20 mg by mouth daily.        . Potassium Chloride (KLOR-CON PO) Take by mouth daily.      . traMADol (ULTRAM) 50 MG tablet Take 50 mg by mouth every 6 (six) hours as needed.        Past Medical History  Diagnosis Date  . Cerebrovascular accident 1975    Right  brainstem  . Colles' fracture     right wrist  . Humerus fracture     proximal  . Syncope     Echo in 12/2010-mild to moderate LVH; normal EF; mild AS and AI  . Aortic valve stenosis   . Spinal stenosis   . Cavernous angioma     Pons/cerebellum  . Diverticulosis   . Anemia, iron deficiency     normal CBC in 2012    Past Surgical History  Procedure Date  . Total abdominal hysterectomy 1940s  . Thyroidectomy 1971    Goiter  . Lumbar spine surgery   . Colonoscopy 2001  . Cataract extraction     Bilateral  . Appendectomy 1940s  . Mastectomy partial / lumpectomy     Left, for benign disease    QIO:NGEXBM of systems complete and found to be negative unless listed above PHYSICAL EXAM BP 114/64  Pulse 66  Resp 16  Ht 5\' 2"  (1.575 m)  Wt 130 lb (58.968 kg)  BMI 23.78 kg/m2  General: Well developed, well nourished, in no acute distress Head: Eyes PERRLA, No xanthomas.  Normal cephalic and atramatic  Lungs: Clear bilaterally to auscultation and percussion. Heart: HRRR S1 S2, 2/6 systolic murmur with radiation to the carotids..Pulsitile carotid pulse on the right. Pulses are 2+ & equal.         . No JVD.  No abdominal bruits. No femoral bruits. Abdomen: Bowel sounds are positive, abdomen soft and non-tender without masses or                  Hernia's noted. Msk:  Back kyphosis, with slow  gait. Diminihsed  strength and tone for age. Uses can for ambulation. Extremities: No clubbing, cyanosis or edema.  DP +1 Neuro: Alert and oriented X 3. Psych:  Good affect, responds appropriately  EKG: Sinus bradycardia rate of 59 bpm.  LAFB. Inferior ST-Twave abnormalities.:  ASSESSMENT AND PLAN

## 2012-01-22 NOTE — Assessment & Plan Note (Signed)
This is chronic for her. She is wearing support hose which have been helpful to her. No further testing is necessary.

## 2012-01-22 NOTE — Assessment & Plan Note (Signed)
She is currently asymptomatic. Will continue medical management.  With age, she is not a surgical candidate.

## 2012-01-22 NOTE — Patient Instructions (Signed)
**Note De-identified  Obfuscation** Your physician recommends that you continue on your current medications as directed. Please refer to the Current Medication list given to you today.  Your physician recommends that you schedule a follow-up appointment in: 1 year  

## 2012-10-21 ENCOUNTER — Emergency Department (HOSPITAL_COMMUNITY)
Admission: EM | Admit: 2012-10-21 | Discharge: 2012-10-21 | Disposition: A | Discharge status: Home / Self Care | Payer: Medicare Other | Attending: Emergency Medicine | Admitting: Emergency Medicine

## 2012-10-21 ENCOUNTER — Encounter (HOSPITAL_COMMUNITY): Payer: Self-pay | Admitting: *Deleted

## 2012-10-21 ENCOUNTER — Emergency Department (HOSPITAL_COMMUNITY): Payer: Medicare Other

## 2012-10-21 DIAGNOSIS — W230XXA Caught, crushed, jammed, or pinched between moving objects, initial encounter: Secondary | ICD-10-CM | POA: Insufficient documentation

## 2012-10-21 DIAGNOSIS — Z8739 Personal history of other diseases of the musculoskeletal system and connective tissue: Secondary | ICD-10-CM | POA: Insufficient documentation

## 2012-10-21 DIAGNOSIS — S61209A Unspecified open wound of unspecified finger without damage to nail, initial encounter: Secondary | ICD-10-CM | POA: Insufficient documentation

## 2012-10-21 DIAGNOSIS — Y939 Activity, unspecified: Secondary | ICD-10-CM | POA: Insufficient documentation

## 2012-10-21 DIAGNOSIS — Z79899 Other long term (current) drug therapy: Secondary | ICD-10-CM | POA: Insufficient documentation

## 2012-10-21 DIAGNOSIS — Y929 Unspecified place or not applicable: Secondary | ICD-10-CM | POA: Insufficient documentation

## 2012-10-21 DIAGNOSIS — Z862 Personal history of diseases of the blood and blood-forming organs and certain disorders involving the immune mechanism: Secondary | ICD-10-CM | POA: Insufficient documentation

## 2012-10-21 DIAGNOSIS — Z23 Encounter for immunization: Secondary | ICD-10-CM | POA: Insufficient documentation

## 2012-10-21 DIAGNOSIS — Z8673 Personal history of transient ischemic attack (TIA), and cerebral infarction without residual deficits: Secondary | ICD-10-CM | POA: Insufficient documentation

## 2012-10-21 DIAGNOSIS — IMO0002 Reserved for concepts with insufficient information to code with codable children: Secondary | ICD-10-CM

## 2012-10-21 DIAGNOSIS — Z8679 Personal history of other diseases of the circulatory system: Secondary | ICD-10-CM | POA: Insufficient documentation

## 2012-10-21 DIAGNOSIS — Z8719 Personal history of other diseases of the digestive system: Secondary | ICD-10-CM | POA: Insufficient documentation

## 2012-10-21 DIAGNOSIS — Z8781 Personal history of (healed) traumatic fracture: Secondary | ICD-10-CM | POA: Insufficient documentation

## 2012-10-21 MED ORDER — TETANUS-DIPHTH-ACELL PERTUSSIS 5-2.5-18.5 LF-MCG/0.5 IM SUSP
0.5000 mL | Freq: Once | INTRAMUSCULAR | Status: AC
  Administered 2012-10-21: 0.5 mL via INTRAMUSCULAR
  Filled 2012-10-21: qty 0.5

## 2012-10-21 MED ORDER — LIDOCAINE HCL (PF) 2 % IJ SOLN
10.0000 mL | Freq: Once | INTRAMUSCULAR | Status: AC
  Administered 2012-10-21: 10 mL
  Filled 2012-10-21: qty 10

## 2012-10-21 MED ORDER — AMOXICILLIN-POT CLAVULANATE 875-125 MG PO TABS
1.0000 | ORAL_TABLET | Freq: Once | ORAL | Status: AC
  Administered 2012-10-21: 1 via ORAL
  Filled 2012-10-21: qty 1

## 2012-10-21 MED ORDER — AMOXICILLIN-POT CLAVULANATE 875-125 MG PO TABS: 1.0000 | ORAL_TABLET | Freq: Two times a day (BID) | ORAL | Status: DC

## 2012-10-21 NOTE — ED Provider Notes (Signed)
History    This chart was scribed for Jaclyn Octave, MD by Charolett Bumpers, ED Scribe. The patient was seen in room APA15/APA15. Patient's care was started at 1219.   CSN: [id number]  Arrival date & time 10/21/12  1158   First MD Initiated Contact with Patient 10/21/12 1219      Chief Complaint  Patient presents with  . Finger Injury    The history is provided by the patient. No language interpreter was used.   Jaclyn Simpson is a 79 y.o. female who presents to the Emergency Department complaining of small laceration to her left thumb that occurred this morning after slamming her thumb in the car door. She denies any pain associated with the injury and states that she can move her thumb normally. Bleeding is controlled in ED and she has the thumb bandaged. She denies any other injuries. She hasn't taken anything PTA. She takes aspirin daily. Last tetanus is unknown.   Past Medical History  Diagnosis Date  . 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 Colles' fracture     right wrist  . Humerus fracture     proximal  . Syncope     Echo in 12/2010-mild to moderate LVH; normal EF; mild AS and AI  . Aortic valve stenosis   . Spinal stenosis   . Cavernous angioma     Pons/cerebellum  . Diverticulosis   . Anemia, iron deficiency     normal CBC in 2012    Past Surgical History  Procedure Laterality Date  . Total abdominal hysterectomy  1940s  . Thyroidectomy  1971    Goiter  . Lumbar spine surgery    . Colonoscopy  2001  . Cataract extraction      Bilateral  . Appendectomy  1940s  . Breast surgery      Family History  Problem Relation Age of Onset  . Heart disease Father   . Stroke Mother   . Pneumonia Brother     x2 brothers  . Heart attack Brother   . Cancer Sister     brain tumor    History  Substance Use Topics  . Smoking status: Never Smoker   . Smokeless tobacco: Never Used  . Alcohol Use: No    OB History   Grav Para Term Preterm  Abortions TAB SAB Ect Mult Living                  Review of Systems  Skin: Positive for wound.  All other systems reviewed and are negative.    Allergies  Review of patient's allergies indicates no known allergies.  Home Medications   Current Outpatient Rx  Name  Route  Sig  Dispense  Refill  . alendronate (FOSAMAX) 70 MG tablet   Oral   Take 70 mg by mouth every 7 (seven) days. Take with a full glass of water on an empty stomach.          . Calcium Carbonate-Vitamin D (CALCIUM-VITAMIN D) 500-200 MG-UNIT per tablet   Oral   Take 1 tablet by mouth 2 (two) times daily with a meal.           . Chlorphen Tan-Phenyleph Tan (R-TANNA PO)   Oral   Take by mouth daily.         . fluvastatin XL (LESCOL XL) 80 MG 24 hr tablet   Oral   Take 80 mg by mouth daily.         Marland Kitchen  furosemide (LASIX) 40 MG tablet   Oral   Take 20 mg by mouth daily.          Marland Kitchen gabapentin (NEURONTIN) 300 MG capsule   Oral   Take 300 mg by mouth 2 (two) times daily.           Marland Kitchen levothyroxine (SYNTHROID, LEVOTHROID) 50 MCG tablet   Oral   Take 50 mcg by mouth daily.           . Multiple Vitamins-Minerals (V-C FORTE PO)   Oral   Take 1 tablet by mouth daily.           Marland Kitchen omeprazole (PRILOSEC) 20 MG capsule   Oral   Take 20 mg by mouth daily.           . Potassium Chloride (KLOR-CON PO)   Oral   Take by mouth daily.         . traMADol (ULTRAM) 50 MG tablet   Oral   Take 50 mg by mouth every 6 (six) hours as needed.           BP 129/59  Pulse 74  Temp(Src) 98.1 F (36.7 C) (Oral)  Resp 20  Ht 5\' 1"  (1.549 m)  Wt 130 lb (58.968 kg)  BMI 24.58 kg/m2  SpO2 97%  Physical Exam  Nursing note and vitals reviewed. Constitutional: She is oriented to person, place, and time. She appears well-developed and well-nourished. No distress.  HENT:  Head: Normocephalic and atraumatic.  Eyes: EOM are normal. Pupils are equal, round, and reactive to light.  Neck: Normal range of  motion. Neck supple. No tracheal deviation present.  Cardiovascular: Normal rate.   Pulmonary/Chest: Effort normal. No respiratory distress.  Abdominal: Soft. She exhibits no distension.  Musculoskeletal: Normal range of motion. She exhibits no edema.  Neurological: She is alert and oriented to person, place, and time.  Skin: Skin is warm and dry.  Transverse laceration to the distal phalanx of the left thumb. Flexion of the IP joint intact. Thumb opposition, extension and sensation intact.   Psychiatric: She has a normal mood and affect. Her behavior is normal.    ED Course  Procedures (including critical care time)  DIAGNOSTIC STUDIES: Oxygen Saturation is 97% on room air, adequate by my interpretation.    COORDINATION OF CARE:  12:30-Discussed planned course of treatment with the patient including wound care and laceration repair, who is agreeable at this time.   12:45-Medication Orders: Lidocaine(Xylocaine) 2% injection 10 mL-once; TDap (Boostrix) injection 0.5 mL-once; Amoxicillin-clavulanate (Augmentin) 875-125 mg per tablet 1 tablet-once.   Labs Reviewed - No data to display Dg Finger Thumb Left  10/21/2012  *RADIOLOGY REPORT*  Clinical Data: Crush injury  LEFT THUMB 2+V  Comparison: None.  Findings: There is mild osteoarthritis of the interphalangeal joint.  No evidence of fracture or dislocation.  IMPRESSION: No acute bony finding.  Mild interphalangeal osteoarthritis.   Original Report Authenticated By: Paulina Fusi, M.D.      No diagnosis found.    MDM  Laceration to left thumb from car door. Flexion, extension, opposition intact. Sensation intact. Bleeding controlled. Not on anticoagulation.  Laceration without any nerve or tendon involvement. Tetanus updated. X-ray negative. Wound closed by PA Beverely Pace.  Stable for discharge. Suture removal 1 week.  I personally performed the services described in this documentation, which was scribed in my presence. The recorded  information has been reviewed and is accurate.      Jaclyn Octave, MD 10/21/12 [phone]

## 2012-10-21 NOTE — ED Provider Notes (Signed)
Medical screening examination/treatment/procedure(s) were conducted as a shared visit with non-physician practitioner(s) and myself.  I personally evaluated the patient during the encounter   [name], MD 10/21/12 1547

## 2012-10-21 NOTE — ED Notes (Signed)
Radiology aware pt ready for xray. PA done suturing.

## 2012-10-21 NOTE — ED Notes (Addendum)
Closed lt thumb in car door at [facility]. Has bandage on thumb. Pt denies pain,

## 2012-10-21 NOTE — ED Provider Notes (Signed)
SUTURE REPAIR LEFT THUMB - patient identified by arm band. Permission for the procedure given by the patient. Procedural time out taken before repair of laceration to the left thumb. The patient has a laceration involving the medial palmar aspect of the left thumb. The procedure was explained to the patient in terms which she understood and she is in agreement with the procedure.  The left thumb was painted with Betadine. Digital block was achieved with 2% plain lidocaine. The wound was irrigated with normal saline inspection of the wound was negative for tendon or bone or major vessel involvement. The wound was then repaired with 5 interrupted sutures of 4-0 nylon. There was good wound edge anastomosis. A dressing was applied. The patient tolerated the procedure without problem. The wound measured 2.6 cm.  Kathie Dike, [place] 10/21/12 1339

## 2012-12-23 ENCOUNTER — Other Ambulatory Visit (HOSPITAL_COMMUNITY): Payer: Self-pay | Admitting: Internal Medicine

## 2012-12-23 ENCOUNTER — Ambulatory Visit (HOSPITAL_COMMUNITY)
Admission: RE | Admit: 2012-12-23 | Discharge: 2012-12-23 | Disposition: A | Discharge status: Home / Self Care | Payer: Medicare Other | Source: Ambulatory Visit | Attending: Internal Medicine | Admitting: Internal Medicine

## 2012-12-23 DIAGNOSIS — R937 Abnormal findings on diagnostic imaging of other parts of musculoskeletal system: Secondary | ICD-10-CM | POA: Insufficient documentation

## 2012-12-23 DIAGNOSIS — M7989 Other specified soft tissue disorders: Secondary | ICD-10-CM | POA: Insufficient documentation

## 2012-12-23 DIAGNOSIS — M19079 Primary osteoarthritis, unspecified ankle and foot: Secondary | ICD-10-CM

## 2012-12-23 DIAGNOSIS — M25579 Pain in unspecified ankle and joints of unspecified foot: Secondary | ICD-10-CM | POA: Insufficient documentation

## 2013-01-01 ENCOUNTER — Encounter: Payer: Self-pay | Admitting: Orthopedic Surgery

## 2013-01-01 ENCOUNTER — Ambulatory Visit (INDEPENDENT_AMBULATORY_CARE_PROVIDER_SITE_OTHER): Payer: Medicare Other | Admitting: Orthopedic Surgery

## 2013-01-01 VITALS — BP 118/72 | Ht 61.0 in | Wt 130.0 lb

## 2013-01-01 DIAGNOSIS — S92309A Fracture of unspecified metatarsal bone(s), unspecified foot, initial encounter for closed fracture: Secondary | ICD-10-CM

## 2013-01-01 DIAGNOSIS — S92301A Fracture of unspecified metatarsal bone(s), right foot, initial encounter for closed fracture: Secondary | ICD-10-CM

## 2013-01-01 NOTE — Patient Instructions (Addendum)
Wear shoe x 6 weeks  

## 2013-01-01 NOTE — Progress Notes (Signed)
Patient ID: Jaclyn Simpson, female   DOB: 1917-06-02, 79 y.o.   MRN: [id number]  Chief Complaint  Patient presents with  . Ankle Pain    Right ankle fracture. DOI 12-19-12. Referral from Dr. Sherwood Gambler.    Patient history 79 year old female ambulates primarily with a cane although a walker has been suggested presents with a two-week history of a fall and then pain over the dorsolateral aspect of her right foot. X-ray was initially read as negative for the ankle but the imaging shows that she has a right fifth metatarsal fracture nondisplaced progression complains of mild pain swelling but denies any numbness tingling. She is able to weight-bear  Review of systems: Snoring is a complaint as well as constipation joint pain and swelling numbness easy bleeding and easy bruising    Past Medical History  Diagnosis Date  . 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 Colles' fracture     right wrist  . Humerus fracture     proximal  . Syncope     Echo in 12/2010-mild to moderate LVH; normal EF; mild AS and AI  . Aortic valve stenosis   . Spinal stenosis   . Cavernous angioma     Pons/cerebellum  . Diverticulosis   . Anemia, iron deficiency     normal CBC in 2012    General appearance is normal, the patient is alert and oriented x3 with normal mood and affect. She indeed ambulates with assistive device of a walker today after insistence from her family  She has a swollen right foot tenderness at the metatarsal proximally ankle range of motion is intact muscle tone is normal skin is intact pulses are good sensation is normal  X-rays Of the are included on the ankle x-ray show that she has a nondisplaced metatarsal fracture proximal edge  Recommend postop shoe she will not tolerate a Cam Walker  Weight-bear as tolerated x-ray in 6 weeks ankle and foot

## 2013-02-13 ENCOUNTER — Ambulatory Visit (INDEPENDENT_AMBULATORY_CARE_PROVIDER_SITE_OTHER): Payer: Medicare Other

## 2013-02-13 ENCOUNTER — Ambulatory Visit (INDEPENDENT_AMBULATORY_CARE_PROVIDER_SITE_OTHER): Payer: Medicare Other | Admitting: Orthopedic Surgery

## 2013-02-13 ENCOUNTER — Encounter: Payer: Self-pay | Admitting: Orthopedic Surgery

## 2013-02-13 VITALS — BP 116/60 | Ht 61.0 in | Wt 130.0 lb

## 2013-02-13 DIAGNOSIS — S92301D Fracture of unspecified metatarsal bone(s), right foot, subsequent encounter for fracture with routine healing: Secondary | ICD-10-CM

## 2013-02-13 DIAGNOSIS — IMO0001 Reserved for inherently not codable concepts without codable children: Secondary | ICD-10-CM

## 2013-02-13 DIAGNOSIS — S82891D Other fracture of right lower leg, subsequent encounter for closed fracture with routine healing: Secondary | ICD-10-CM

## 2013-02-13 DIAGNOSIS — S8290XD Unspecified fracture of unspecified lower leg, subsequent encounter for closed fracture with routine healing: Secondary | ICD-10-CM

## 2013-02-13 NOTE — Patient Instructions (Signed)
activities as tolerated 

## 2013-02-13 NOTE — Progress Notes (Signed)
Patient ID: Jaclyn Simpson, female   DOB: [birth year], 79 y.o.   MRN: [id number] Chief Complaint  Patient presents with  . Follow-up    6 week recheck right ankle fracture DOI 12/19/12    The patient has had a right fifth metatarsal fracture. It was seen on an ankle film we did not repeat. There is no tenderness or pain she has some chronic swelling in her left foot she was able to wear normal shoe  No tenderness over the fracture site  Recommend resume normal activity with a walker followup as needed

## 2013-05-22 ENCOUNTER — Ambulatory Visit (INDEPENDENT_AMBULATORY_CARE_PROVIDER_SITE_OTHER): Payer: Medicare Other | Admitting: Otolaryngology

## 2013-05-22 DIAGNOSIS — H902 Conductive hearing loss, unspecified: Secondary | ICD-10-CM

## 2013-05-22 DIAGNOSIS — H6980 Other specified disorders of Eustachian tube, unspecified ear: Secondary | ICD-10-CM

## 2013-05-22 DIAGNOSIS — B369 Superficial mycosis, unspecified: Secondary | ICD-10-CM

## 2013-05-29 ENCOUNTER — Ambulatory Visit (INDEPENDENT_AMBULATORY_CARE_PROVIDER_SITE_OTHER): Payer: Medicare Other | Admitting: Otolaryngology

## 2013-05-29 DIAGNOSIS — H903 Sensorineural hearing loss, bilateral: Secondary | ICD-10-CM

## 2013-05-29 DIAGNOSIS — H6120 Impacted cerumen, unspecified ear: Secondary | ICD-10-CM

## 2013-06-25 ENCOUNTER — Other Ambulatory Visit (HOSPITAL_COMMUNITY): Payer: Self-pay | Admitting: Family Medicine

## 2013-06-25 DIAGNOSIS — M7989 Other specified soft tissue disorders: Secondary | ICD-10-CM

## 2013-06-25 DIAGNOSIS — M79609 Pain in unspecified limb: Secondary | ICD-10-CM

## 2013-06-27 ENCOUNTER — Ambulatory Visit (HOSPITAL_COMMUNITY)
Admission: RE | Admit: 2013-06-27 | Discharge: 2013-06-27 | Disposition: A | Discharge status: Home / Self Care | Payer: Medicare Other | Source: Ambulatory Visit | Attending: Family Medicine | Admitting: Family Medicine

## 2013-06-27 DIAGNOSIS — M7989 Other specified soft tissue disorders: Secondary | ICD-10-CM

## 2013-06-27 DIAGNOSIS — M799 Soft tissue disorder, unspecified: Secondary | ICD-10-CM | POA: Insufficient documentation

## 2013-06-27 DIAGNOSIS — M79609 Pain in unspecified limb: Secondary | ICD-10-CM

## 2013-08-27 ENCOUNTER — Other Ambulatory Visit (HOSPITAL_COMMUNITY): Payer: Self-pay | Admitting: Physician Assistant

## 2013-08-27 DIAGNOSIS — K5900 Constipation, unspecified: Secondary | ICD-10-CM

## 2013-08-29 ENCOUNTER — Ambulatory Visit (HOSPITAL_COMMUNITY): Payer: Medicare Other

## 2013-09-01 ENCOUNTER — Other Ambulatory Visit (HOSPITAL_COMMUNITY): Payer: Self-pay | Admitting: Physician Assistant

## 2013-09-01 ENCOUNTER — Ambulatory Visit (HOSPITAL_COMMUNITY)
Admission: RE | Admit: 2013-09-01 | Discharge: 2013-09-01 | Disposition: A | Discharge status: Home / Self Care | Payer: Medicare Other | Source: Ambulatory Visit | Attending: Physician Assistant | Admitting: Physician Assistant

## 2013-09-01 DIAGNOSIS — I7090 Unspecified atherosclerosis: Secondary | ICD-10-CM | POA: Insufficient documentation

## 2013-09-01 DIAGNOSIS — K5900 Constipation, unspecified: Secondary | ICD-10-CM | POA: Insufficient documentation

## 2013-09-01 DIAGNOSIS — Z8719 Personal history of other diseases of the digestive system: Secondary | ICD-10-CM | POA: Insufficient documentation

## 2013-09-01 DIAGNOSIS — K838 Other specified diseases of biliary tract: Secondary | ICD-10-CM | POA: Insufficient documentation

## 2013-09-01 DIAGNOSIS — K5730 Diverticulosis of large intestine without perforation or abscess without bleeding: Secondary | ICD-10-CM | POA: Insufficient documentation

## 2013-09-01 DIAGNOSIS — K8020 Calculus of gallbladder without cholecystitis without obstruction: Secondary | ICD-10-CM | POA: Insufficient documentation

## 2013-09-01 MED ORDER — IOHEXOL 300 MG/ML  SOLN
100.0000 mL | Freq: Once | INTRAMUSCULAR | Status: AC | PRN
  Administered 2013-09-01: 100 mL via INTRAVENOUS

## 2013-09-12 ENCOUNTER — Encounter: Payer: Self-pay | Admitting: *Deleted

## 2013-09-15 ENCOUNTER — Encounter: Payer: Self-pay | Admitting: Obstetrics and Gynecology

## 2013-09-15 ENCOUNTER — Ambulatory Visit (INDEPENDENT_AMBULATORY_CARE_PROVIDER_SITE_OTHER): Payer: Medicare Other | Admitting: Obstetrics and Gynecology

## 2013-09-15 ENCOUNTER — Other Ambulatory Visit (HOSPITAL_COMMUNITY)
Admission: RE | Admit: 2013-09-15 | Discharge: 2013-09-15 | Disposition: A | Discharge status: Home / Self Care | Payer: Medicare Other | Source: Ambulatory Visit | Attending: Obstetrics and Gynecology | Admitting: Obstetrics and Gynecology

## 2013-09-15 VITALS — BP 122/64 | Ht 62.0 in | Wt 125.8 lb

## 2013-09-15 DIAGNOSIS — D3910 Neoplasm of uncertain behavior of unspecified ovary: Secondary | ICD-10-CM

## 2013-09-15 DIAGNOSIS — Z1212 Encounter for screening for malignant neoplasm of rectum: Secondary | ICD-10-CM

## 2013-09-15 DIAGNOSIS — Z1151 Encounter for screening for human papillomavirus (HPV): Secondary | ICD-10-CM | POA: Insufficient documentation

## 2013-09-15 DIAGNOSIS — D4959 Neoplasm of unspecified behavior of other genitourinary organ: Secondary | ICD-10-CM

## 2013-09-15 DIAGNOSIS — D3911 Neoplasm of uncertain behavior of right ovary: Secondary | ICD-10-CM

## 2013-09-15 DIAGNOSIS — Z01419 Encounter for gynecological examination (general) (routine) without abnormal findings: Secondary | ICD-10-CM

## 2013-09-15 DIAGNOSIS — Z124 Encounter for screening for malignant neoplasm of cervix: Secondary | ICD-10-CM | POA: Insufficient documentation

## 2013-09-15 NOTE — Patient Instructions (Signed)
We will draw a lab test today Ca125, and order u/s at [facility] in the next 14 days. Followup here 14 days

## 2013-09-15 NOTE — Progress Notes (Signed)
Patient ID: Jaclyn Simpson, female   DOB: 05-Feb-1917, 80 y.o.   MRN: [id number] Hemoccult blood test resulted negative.

## 2013-09-15 NOTE — Progress Notes (Signed)
[facility] Visit  Patient name: Jaclyn Simpson MRN [id number]  Date of birth: 06-23-1917  CC & HPI:  Jaclyn Simpson is a 80 y.o. female presenting today for an ovarian mass that was discovered 2 weeks ago. She was told that a 7.6 cm cystic mass was found on the right ovary via a CT scan. She reports having associated diarrhea. She reports having a total abdominal hysterectomy in the 1940's. She reports having a grapefruit sized mass on her uterus at that time. She denies any other symptoms at this time.    PCP [facility]  ROS:  Negative except for diarrhea.   Pertinent History Reviewed:  Medical Hx:  Past Medical History  Diagnosis Date  . 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 Colles' fracture     right wrist  . Humerus fracture     proximal  . Syncope     Echo in 12/2010-mild to moderate LVH; normal EF; mild AS and AI  . Aortic valve stenosis   . Spinal stenosis   . Cavernous angioma     Pons/cerebellum  . Diverticulosis   . Anemia, iron deficiency     normal CBC in 2012   Surgical Hx:  Reviewed: Significant for  Past Surgical History  Procedure Laterality Date  . Total abdominal hysterectomy  1940s  . Thyroidectomy  1971    Goiter  . Lumbar spine surgery    . Colonoscopy  2001  . Cataract extraction      Bilateral  . Appendectomy  1940s  . Breast surgery      Medications: Reviewed & Updated - see associated section  Prior to Admission medications   Medication Sig Start Date End Date Taking? Authorizing Provider  alendronate (FOSAMAX) 70 MG tablet Take 70 mg by mouth every 7 (seven) days. Take with a full glass of water on an empty stomach.    Yes Historical Provider, MD  Calcium Carbonate-Vitamin D (CALCIUM-VITAMIN D) 500-200 MG-UNIT per tablet Take 1 tablet by mouth 2 (two) times daily with a meal.     Yes Historical Provider, MD  fluticasone (FLONASE) 50 MCG/ACT nasal spray Place into both nostrils daily.   Yes Historical  Provider, MD  furosemide (LASIX) 40 MG tablet Take 20 mg by mouth daily.    Yes Historical Provider, MD  gabapentin (NEURONTIN) 300 MG capsule Take 300 mg by mouth 2 (two) times daily.     Yes Historical Provider, MD  HYDROcodone-homatropine (HYCODAN) 5-1.5 MG/5ML syrup Take 5 mLs by mouth every 6 (six) hours as needed for cough.   Yes Historical Provider, MD  levothyroxine (SYNTHROID, LEVOTHROID) 50 MCG tablet Take 50 mcg by mouth daily before breakfast.   Yes Historical Provider, MD  neomycin-polymyxin-dexamethasone (MAXITROL) 0.1 % ophthalmic suspension 1 drop 4 (four) times daily.   Yes Historical Provider, MD  omeprazole (PRILOSEC) 20 MG capsule Take 20 mg by mouth daily.     Yes Historical Provider, MD  polyethylene glycol (MIRALAX / GLYCOLAX) packet Take 17 g by mouth daily.   Yes Historical Provider, MD  simvastatin (ZOCOR) 20 MG tablet Take 20 mg by mouth daily.   Yes Historical Provider, MD  tobramycin (TOBREX) 0.3 % ophthalmic solution every 4 (four) hours.   Yes Historical Provider, MD  traMADol (ULTRAM) 50 MG tablet Take 50 mg by mouth every 6 (six) hours as needed.   Yes Historical Provider, MD  vitamin C (ASCORBIC ACID) 500 MG tablet Take 500  mg by mouth daily.   Yes Historical Provider, MD  amoxicillin-clavulanate (AUGMENTIN) 875-125 MG per tablet Take 1 tablet by mouth every 12 (twelve) hours. 10/21/12   Ezequiel Essex, MD  Chlorphen Tan-Phenyleph Tan (R-TANNA PO) Take by mouth daily.    Historical Provider, MD  fluvastatin XL (LESCOL XL) 80 MG 24 hr tablet Take 80 mg by mouth daily.    Historical Provider, MD  Multiple Vitamins-Minerals (V-C FORTE PO) Take 1 tablet by mouth daily.      Historical Provider, MD  Potassium Chloride (KLOR-CON PO) Take by mouth daily.    Historical Provider, MD  promethazine-codeine (PHENERGAN WITH CODEINE) 6.25-10 MG/5ML syrup Take by mouth every 6 (six) hours as needed for cough.    Historical Provider, MD    Social History: Reviewed -  reports that  she has never smoked. She has never used smokeless tobacco.  Objective Findings:  Vitals: BP 122/64  Ht 5\' 2"  (1.575 m)  Wt 125 lb 12.8 oz (57.063 kg)  BMI 23.00 kg/m2  Physical Examination: Pelvic - VULVA: normal appearing vulva with no masses, tenderness or lesions, atrophic , VAGINA: normal appearing vagina with normal color and discharge, no lesions, atrophic, PELVIC FLOOR EXAM: no cystocele, rectocele or prolapse noted,  CERVIX: normal appearing cervix without discharge or lesions, surgically absent, UTERUS: surgically absent, vaginal cuff well healed, ADNEXA: mass present right, barely appreciable, no fixation side, size 7 cm, by prior CT , RECTAL: normal rectal, no masses, guaiac negative stool obtained, exam limited by pt tissue atrophy , PAP: Pap smear done today  Pelvic Exam: Vague fullness on the right. No fixation. Atrophic vaginal tissues. Cervix and uterus absent. Adnexa non tender.   Rectal exam: Good rectal support, no rectocele. Heme occult negative  Assessment & Plan:   Plan to order lab work today and [place]  @ [facility] within the next 2 weeks. Follow up here in 2 weeks.

## 2013-09-16 LAB — CA 125: CA 125: 12.6 U/mL (ref 0.0–30.2)

## 2013-09-18 ENCOUNTER — Other Ambulatory Visit (HOSPITAL_COMMUNITY): Payer: Medicare Other

## 2013-09-18 ENCOUNTER — Ambulatory Visit (HOSPITAL_COMMUNITY)
Admission: RE | Admit: 2013-09-18 | Discharge: 2013-09-18 | Disposition: A | Discharge status: Home / Self Care | Payer: Medicare Other | Source: Ambulatory Visit | Attending: Obstetrics and Gynecology | Admitting: Obstetrics and Gynecology

## 2013-09-18 ENCOUNTER — Ambulatory Visit (INDEPENDENT_AMBULATORY_CARE_PROVIDER_SITE_OTHER): Payer: Medicare Other | Admitting: Otolaryngology

## 2013-09-18 DIAGNOSIS — R1900 Intra-abdominal and pelvic swelling, mass and lump, unspecified site: Secondary | ICD-10-CM | POA: Insufficient documentation

## 2013-09-18 DIAGNOSIS — N838 Other noninflammatory disorders of ovary, fallopian tube and broad ligament: Secondary | ICD-10-CM | POA: Insufficient documentation

## 2013-09-18 DIAGNOSIS — D4959 Neoplasm of unspecified behavior of other genitourinary organ: Secondary | ICD-10-CM

## 2013-09-22 ENCOUNTER — Telehealth: Payer: Self-pay | Admitting: Obstetrics and Gynecology

## 2013-09-22 NOTE — Telephone Encounter (Signed)
Results of u/s showing large mass cystic discussed with Ms Leoni' guardian, along with Ca-125 of 12.6.  FOllowup in 3 months for rechk u/s and Ca-125 recommended, and pt's guardian agrees with the plan.  followup appt rescheduled for 3 months for u/s and ca-125.

## 2013-09-25 ENCOUNTER — Ambulatory Visit (INDEPENDENT_AMBULATORY_CARE_PROVIDER_SITE_OTHER): Payer: Medicare Other | Admitting: Otolaryngology

## 2013-09-25 DIAGNOSIS — H60399 Other infective otitis externa, unspecified ear: Secondary | ICD-10-CM

## 2013-09-29 ENCOUNTER — Ambulatory Visit: Payer: PRIVATE HEALTH INSURANCE | Admitting: Obstetrics and Gynecology

## 2013-10-08 ENCOUNTER — Telehealth: Payer: Self-pay | Admitting: *Deleted

## 2013-10-09 NOTE — Telephone Encounter (Signed)
Pt niece, states patient has a "knot on her abdomen that was not there at her last appt with Dr. Glo Herring." Offered an appt tomorrow with Dr. Carmell Austria but unable to come due to sickness, appt scheduled for next Tuesday at 3:30 pm.

## 2013-10-14 ENCOUNTER — Ambulatory Visit (INDEPENDENT_AMBULATORY_CARE_PROVIDER_SITE_OTHER): Payer: Medicare Other | Admitting: Obstetrics and Gynecology

## 2013-10-14 ENCOUNTER — Encounter: Payer: Self-pay | Admitting: Obstetrics and Gynecology

## 2013-10-14 VITALS — BP 112/48 | Ht 62.0 in | Wt 125.8 lb

## 2013-10-14 DIAGNOSIS — D3910 Neoplasm of uncertain behavior of unspecified ovary: Secondary | ICD-10-CM

## 2013-10-14 NOTE — Progress Notes (Signed)
   [facility] Visit  Patient name: Jaclyn Simpson MRN [id number]  Date of birth: Sep 26, 1916  CC & HPI:  Jaclyn Simpson is a 80 y.o. female presenting today for ? New fullness in LLQ, pt concerned it is a hernia. NO pain,  Being followed for a stable assyptomatic left ovarian mass with normal Ca125 of 12., with plans to recheck by u/s and Ca125 at  3 months and then 6 more months.  ROS:  NO weight gain or loss,   Pertinent History Reviewed:  Medical & Surgical Hx:  Reviewed: Significant for no change Medications: Reviewed & Updated - see associated section Social History: Reviewed -  reports that she has never smoked. She has never used smokeless tobacco.  Objective Findings:  Vitals: BP 112/48  Ht 5\' 2"  (1.575 m)  Wt 125 lb 12.8 oz (57.063 kg)  BMI 23.00 kg/m2  Physical Examination: General appearance - alert, well appearing, and in no distress Mental status - alert, oriented to person, place, and time Abdomen - soft, nontender, nondistended, no masses or organomegaly HERNIA EXAM: nontender, no hernias found on exam Skin - LESIONS NOTED: normal complete skin exam, no suspicious lesions, 2x 1.5 cm area in hair covered area in left inguinal area, erythematous, c/w eczema, no bleeding or infection   Assessment & Plan:   Continue with plans for monitoring of ovarian mass in [facility].

## 2013-10-14 NOTE — Patient Instructions (Signed)
Keep appt in April.

## 2013-11-27 ENCOUNTER — Ambulatory Visit (INDEPENDENT_AMBULATORY_CARE_PROVIDER_SITE_OTHER): Payer: Medicare Other | Admitting: Otolaryngology

## 2013-11-27 DIAGNOSIS — H60399 Other infective otitis externa, unspecified ear: Secondary | ICD-10-CM

## 2013-11-27 DIAGNOSIS — H6120 Impacted cerumen, unspecified ear: Secondary | ICD-10-CM

## 2013-12-18 ENCOUNTER — Ambulatory Visit (INDEPENDENT_AMBULATORY_CARE_PROVIDER_SITE_OTHER): Payer: Medicare Other | Admitting: Otolaryngology

## 2013-12-18 DIAGNOSIS — H6120 Impacted cerumen, unspecified ear: Secondary | ICD-10-CM

## 2013-12-18 DIAGNOSIS — H60399 Other infective otitis externa, unspecified ear: Secondary | ICD-10-CM

## 2013-12-22 ENCOUNTER — Ambulatory Visit (INDEPENDENT_AMBULATORY_CARE_PROVIDER_SITE_OTHER): Payer: Medicare Other | Admitting: Obstetrics and Gynecology

## 2013-12-22 ENCOUNTER — Encounter: Payer: Self-pay | Admitting: Obstetrics and Gynecology

## 2013-12-22 VITALS — BP 118/64 | Ht 62.0 in | Wt 128.2 lb

## 2013-12-22 DIAGNOSIS — D3910 Neoplasm of uncertain behavior of unspecified ovary: Secondary | ICD-10-CM

## 2013-12-22 DIAGNOSIS — R1900 Intra-abdominal and pelvic swelling, mass and lump, unspecified site: Secondary | ICD-10-CM

## 2013-12-22 NOTE — Progress Notes (Signed)
This chart was scribed by Ludger Nutting, Medical Scribe, for Dr. Mallory Shirk on 12/22/13 at 11:37 AM. This chart was reviewed by Dr. Mallory Shirk and is accurate.   [facility] Visit  Patient name: Jaclyn Simpson MRN [id number]  Date of birth: [birth year]  CC & HPI:  Jaclyn Simpson is a 80 y.o. female presenting today for follow up for a right ovarian mass that was discovered on a CT in December 2014. She had a total hysterectomy in 1940's.   ROS:  Negative except noted above.   Pertinent History Reviewed:  Medical & Surgical Hx:  Reviewed: Significant for  Past Medical History  Diagnosis Date  . 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 Colles' fracture     right wrist  . Humerus fracture     proximal  . Syncope     Echo in 12/2010-mild to moderate LVH; normal EF; mild AS and AI  . Aortic valve stenosis   . Spinal stenosis   . Cavernous angioma     Pons/cerebellum  . Diverticulosis   . Anemia, iron deficiency     normal CBC in 2012    Past Surgical History  Procedure Laterality Date  . Total abdominal hysterectomy  1940s  . Thyroidectomy  1971    Goiter  . Lumbar spine surgery    . Colonoscopy  2001  . Cataract extraction      Bilateral  . Appendectomy  1940s  . Breast surgery      Medications: Reviewed & Updated - see associated section Social History: Reviewed -  reports that she has never smoked. She has never used smokeless tobacco.  Objective Findings:  Vitals: BP 118/64  Ht 5\' 2"  (1.575 m)  Wt 128 lb 3.2 oz (58.151 kg)  BMI 23.44 kg/m2  Physical Examination: General appearance - alert, well appearing, and in no distress and oriented to person, place, and time Pelvic - examination not indicated   Assessment & Plan:   A: 1. Pelvic mass with normal Ca125  P: 1. Lab studies today repeat  Ca125 2. Pelvic ultrasound at [facility]

## 2013-12-22 NOTE — Progress Notes (Signed)
Ultrasound appointment made for Wednesday 12/24/2013 at 10:45 am [facility].

## 2013-12-22 NOTE — Addendum Note (Signed)
Addended by: Traci Sermon A on: 12/22/2013 01:58 PM   Modules accepted: Orders

## 2013-12-22 NOTE — Patient Instructions (Signed)
We will order a repeat ultrasound and blood test(Ca-125) to compare with prior labs. We should be able to talk  With you within 3 days of completion of the ultrasound     Pelvic Mass A "mass" is a lump that either your caregiver found during an examination or you found before seeing your caregiver. The "pelvis" is the lower portion of the trunk in between the hip bones. There are many possible reasons why a lump has appeared. Testing will help determine the cause and the steps to a solution. CAUSES  Before complete testing is done, it may be difficult or impossible for your caregiver to know if the lump is truly in one of the pelvic organs (such as the uterus or ovaries) or is coming from one of many organs that are near the pelvis. Problems in the colon or kidney can also lead to a lump that might seem to be in the pelvis. If testing shows that the mass is in the pelvis, there are still many possible causes:  Tumors and cancers. These problems are relatively common and are the greatest source of worry for patients. Cancerous lumps in the pelvis may be due to cancers that started in the uterus or ovaries or due to cancers that started in other areas and then spread to the pelvis. Many cancers are very treatable when found early.  Non-cancerous tumors and masses. There are a large number of common and uncommon non-cancerous problems that can lead to a mass in the pelvis. Two very common ones are fibroids of the uterus and ovarian cysts. Before testing and/or surgery, it may be impossible to tell the difference between these problems and a cancer.  Infection. Certain types of infections can produce a mass in the pelvis. The infection might be caused by bacteria. If there is an infection treatment might include antibiotics. Masses from infection can also be caused by certain viruses, and in rare cases, by fungi or parasites. If infection is the cause, your caregiver will be able to determine the type  of germ responsible for the mass by doing appropriate testing.  Inflammatory bowel disease. These are diseases thought to be caused by a defect in the immune system of the intestine. There are two inflammatory bowel diseases: Ulcerative Colitis and Crohn's Disease. They are lifelong problems with symptoms that can come and go. Sometimes, patients with these diseases will develop a mass in the lower part of the colon that can make it seem as though there is a mass in the pelvis.  Past Surgery. If there has been pelvic surgery in the past, and there is a lot of scarring that forms during the process of healing, this can eventually fell like a mass when examined by your caregiver. As with the other problems described above, this may or may not be associated with symptoms or feeling badly.  Ectopic Pregnancy. This is a condition where the growing fetus is growing outside of the uterus. This is a common cause for a pelvic mass and may become a serious or life-threatening problem that requires immediate surgery. SYMPTOMS  In people with a pelvic mass, there may be a large variety of associated symptoms including:   No symptoms, other than the appearance of the mass itself.  Cramping, nausea, diarrhea.  Fever, vomiting, weakness.  Pelvic, side, and/or back pain.  Weight loss.  Constipation.  Problems with vaginal bleeding. This can be very variable. Bleeding might be very light or very heavy. Bleeding may  be mixed with large clots. Menstruation may be very frequent and may seem to almost completely stop. There may be varying levels of pain with menstruation.  Urinary symptoms including frequent urination, inability to empty the bladder completely, or urinating very small amounts. DIAGNOSIS  Because of the large number of causes of a mass in the pelvis, your caregiver will ask you to undergo testing in order to get a clear diagnosis in a timely manner. The tests might include some or all of the  following:  Blood tests such as a blood count, measurement of common minerals in the blood, kidney/liver/pancreas function, pregnancy test, and others.  X-rays. Plain x-rays and special x-rays may be requested except if you are pregnant.  Ultrasound. This is a test that uses sound waves to "paint a picture" of the mass. The type of "sound picture" that is seen can help to narrow the diagnosis.  CAT scan and MRI imaging. Each can provide additional information as to the different characteristics of the mass and can help to develop a final plan for diagnostics and treatment. If cancer is suspected, these special tests can also help to show any spread of the cancer to other parts of the body. It is possible that these tests may not be ordered if you are pregnant.  Laparoscopy. This is a special exam of the inside of the pelvic area using a slim, flexible, lighted tube. This allows your caregiver to get a direct look at the mass. Sometimes, this allows getting a very small piece of the mass (a biopsy). This piece of tissue can then be examined in a lab that will frequently lead to a clear diagnosis. In some cases, your caregiver can use a laparoscope to completely remove the mass after it has been examined.  Surgery. Sometimes, a diagnosis can only be made by carrying out an operation and obtaining a biopsy (as noted above). Many times, the biopsy is obtained and the mass is removed during the same operation. These are the most common ways for determining the exact cause of the mass. Your caregiver may recommend other tests that are not listed here. TREATMENT  Treatment(s) can only be recommended after a diagnosis is made. Your caregiver will discuss your test results with you, the meanings of the tests, and the recommended steps to begin treatment. He/she will also recommend whether you need to be examined by specialists as you go through the steps of diagnosis and a treatment plan is developed. HOME  CARE INSTRUCTIONS   Test preparation. Carefully follow instructions when preparing for certain tests. This may involve many things such as:  Drinking fluids to fill the bladder before a pelvic ultrasound.  Fasting before certain blood tests.  Drinking special "contrast" fluids that are necessary for obtaining the best CAT scan and MRI images.  Medications. Your caregiver may prescribe medications to help relieve symptoms while you undergo testing. It is important that your current medications (prescription, non-prescription, herbal, vitamins, etc.) be kept in mind when new prescriptions are recommended.  Diet. There may be a need for changes in diet in order to help with symptom relief while testing is being done. If this applies to you, your caregiver will discuss these changes with you. SEEK MEDICAL CARE IF:   You cannot hold down any of the recommended fluids used to prepare for tests such as CAT scan MRI.  You feel that you are having trouble with any new prescriptions.  You develop new symptoms of pain, vomiting,  diarrhea, fever, or other problems that you did not feel since your last exam.  You experience inability to empty your bladder completely or develop painful and/or bloody urination. SEEK IMMEDIATE MEDICAL CARE IF:   You vomit bright red blood, or a coffee ground appearing material.  You have blood in your stools, or the stools turn black and tarry.  You have an abnormal or increased amount of vaginal bleeding.  You have a fever.  You develop easy bruising or bleeding.  You develop pain that is not controlled by your medication.  You feel worsening weakness or you have a fainting episode.  You feel that the mass has suddenly gotten larger.  You develop severe bloating in the abdomen and/or pelvis.  You cannot pass any urine. MAKE SURE YOU:   Understand these instructions.  Will watch your condition.  Will get help right away if you are not doing well or  get worse. Document Released: 12/05/2006 Document Revised: 11/20/2011 Document Reviewed: 08/13/2007 [facility] Patient Information 2014 [place], [place].

## 2013-12-23 LAB — CA 125: CA 125: 13.9 U/mL (ref 0.0–30.2)

## 2013-12-24 ENCOUNTER — Other Ambulatory Visit: Payer: Self-pay | Admitting: Obstetrics and Gynecology

## 2013-12-24 ENCOUNTER — Ambulatory Visit (HOSPITAL_COMMUNITY)
Admission: RE | Admit: 2013-12-24 | Discharge: 2013-12-24 | Disposition: A | Discharge status: Home / Self Care | Payer: Medicare Other | Source: Ambulatory Visit | Attending: Obstetrics and Gynecology | Admitting: Obstetrics and Gynecology

## 2013-12-24 DIAGNOSIS — R1900 Intra-abdominal and pelvic swelling, mass and lump, unspecified site: Secondary | ICD-10-CM

## 2013-12-24 DIAGNOSIS — N83209 Unspecified ovarian cyst, unspecified side: Secondary | ICD-10-CM | POA: Insufficient documentation

## 2013-12-30 ENCOUNTER — Telehealth: Payer: Self-pay | Admitting: Obstetrics and Gynecology

## 2013-12-30 NOTE — Telephone Encounter (Signed)
I spoke with Dr. Glo Herring and he advised that he had taken care of everything. And that they were aware of results.

## 2014-02-12 ENCOUNTER — Ambulatory Visit (INDEPENDENT_AMBULATORY_CARE_PROVIDER_SITE_OTHER): Payer: Medicare Other | Admitting: Otolaryngology

## 2014-02-12 DIAGNOSIS — H6120 Impacted cerumen, unspecified ear: Secondary | ICD-10-CM

## 2014-02-12 DIAGNOSIS — H60399 Other infective otitis externa, unspecified ear: Secondary | ICD-10-CM

## 2014-03-26 ENCOUNTER — Ambulatory Visit (INDEPENDENT_AMBULATORY_CARE_PROVIDER_SITE_OTHER): Payer: Medicare Other | Admitting: Otolaryngology

## 2014-03-26 DIAGNOSIS — H60399 Other infective otitis externa, unspecified ear: Secondary | ICD-10-CM

## 2014-03-26 DIAGNOSIS — H6120 Impacted cerumen, unspecified ear: Secondary | ICD-10-CM

## 2014-05-28 ENCOUNTER — Ambulatory Visit (INDEPENDENT_AMBULATORY_CARE_PROVIDER_SITE_OTHER): Payer: Medicare Other | Admitting: Otolaryngology

## 2014-05-28 DIAGNOSIS — H60399 Other infective otitis externa, unspecified ear: Secondary | ICD-10-CM

## 2014-06-24 ENCOUNTER — Ambulatory Visit (INDEPENDENT_AMBULATORY_CARE_PROVIDER_SITE_OTHER): Payer: Medicare Other | Admitting: Obstetrics and Gynecology

## 2014-06-24 ENCOUNTER — Encounter: Payer: Self-pay | Admitting: Obstetrics and Gynecology

## 2014-06-24 VITALS — BP 98/54 | Ht 59.0 in | Wt 138.0 lb

## 2014-06-24 DIAGNOSIS — D3911 Neoplasm of uncertain behavior of right ovary: Secondary | ICD-10-CM

## 2014-06-24 DIAGNOSIS — N838 Other noninflammatory disorders of ovary, fallopian tube and broad ligament: Secondary | ICD-10-CM

## 2014-06-24 DIAGNOSIS — N839 Noninflammatory disorder of ovary, fallopian tube and broad ligament, unspecified: Secondary | ICD-10-CM

## 2014-06-24 NOTE — Progress Notes (Signed)
Patient ID: Jaclyn Simpson, female   DOB: May 02, 1917, 80 y.o.   MRN: [id number]   [facility] Visit  Patient name: Jaclyn Simpson MRN [id number]  Date of birth: 1917-04-15  CC & HPI:  [name] is a 80 y.o. female presenting today for follow up on ovarian mass. She states that   ROS:  +ovarian mass No other complaints  Pertinent History Reviewed:   Reviewed: Significant for  Medical         Past Medical History  Diagnosis Date  . 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 Colles' fracture     right wrist  . Humerus fracture     proximal  . Syncope     Echo in 12/2010-mild to moderate LVH; normal EF; mild AS and AI  . Aortic valve stenosis   . Spinal stenosis   . Cavernous angioma     Pons/cerebellum  . Diverticulosis   . Anemia, iron deficiency     normal CBC in 2012  . Cyst of skin     right inner thigh                              Surgical Hx:    Past Surgical History  Procedure Laterality Date  . Total abdominal hysterectomy  1940s  . Thyroidectomy  1971    Goiter  . Lumbar spine surgery    . Colonoscopy  2001  . Cataract extraction      Bilateral  . Appendectomy  1940s  . Breast surgery     Medications: Reviewed & Updated - see associated section                      Current outpatient prescriptions:alendronate (FOSAMAX) 70 MG tablet, Take 70 mg by mouth every 7 (seven) days. Take with a full glass of water on an empty stomach. , Disp: , Rfl: ;  Calcium Carbonate-Vitamin D (CALCIUM-VITAMIN D) 500-200 MG-UNIT per tablet, Take 1 tablet by mouth 2 (two) times daily with a meal.  , Disp: , Rfl: ;  cephALEXin (KEFLEX) 500 MG capsule, , Disp: , Rfl:  fluticasone (FLONASE) 50 MCG/ACT nasal spray, Place into both nostrils daily., Disp: , Rfl: ;  furosemide (LASIX) 40 MG tablet, Take 20 mg by mouth daily. , Disp: , Rfl: ;  gabapentin (NEURONTIN) 300 MG capsule, Take 300 mg by mouth 2 (two) times daily.  , Disp: , Rfl: ;   HYDROcodone-acetaminophen (NORCO/VICODIN) 5-325 MG per tablet, , Disp: , Rfl:  levothyroxine (SYNTHROID, LEVOTHROID) 50 MCG tablet, Take 50 mcg by mouth daily before breakfast., Disp: , Rfl: ;  Multiple Vitamins-Minerals (V-C FORTE PO), Take 1 tablet by mouth daily.  , Disp: , Rfl: ;  neomycin-polymyxin-dexamethasone (MAXITROL) 0.1 % ophthalmic suspension, 1 drop 4 (four) times daily., Disp: , Rfl: ;  omeprazole (PRILOSEC) 20 MG capsule, Take 20 mg by mouth daily.  , Disp: , Rfl:  polyethylene glycol (MIRALAX / GLYCOLAX) packet, Take 17 g by mouth daily., Disp: , Rfl: ;  Potassium Chloride (KLOR-CON PO), Take by mouth daily., Disp: , Rfl: ;  promethazine-codeine (PHENERGAN WITH CODEINE) 6.25-10 MG/5ML syrup, Take by mouth every 6 (six) hours as needed for cough., Disp: , Rfl:    Social History: Reviewed -  reports that she has never smoked. She has never used smokeless tobacco.  Objective Findings:  Vitals: Blood pressure 98/54, height  4\' 11"  (1.499 m), weight 138 lb (62.596 kg).  Physical Examination: General appearance - alert, well appearing, and in no distress and normal appearing weight Mental status - alert, oriented to person, place, and time, normal mood, behavior, speech, dress, motor activity, and thought processes   Assessment & Plan:   A:  1. Ovarian mass with normal Ca125.  P:  1. followup by phone, if number increases will u/s. 2. If unchanged , follow x 1 yr     This chart was scribed for Jaclyn Kind, MD by Steva Colder, ED Scribe. The patient was seen in room 1 at 11:00 AM.

## 2014-06-24 NOTE — Progress Notes (Signed)
Patient ID: Jaclyn Simpson, female   DOB: 09-18-1916, 80 y.o.   MRN: [id number] Pt here today for follow up. Pt denies any problems or concerns at this time.

## 2014-06-25 LAB — CA 125: CA 125: 17 U/mL (ref <35)

## 2014-07-14 ENCOUNTER — Telehealth: Payer: Self-pay | Admitting: Obstetrics and Gynecology

## 2014-07-14 NOTE — Telephone Encounter (Signed)
Spoke with [facility] regarding pt. She was asking about the CA 125 results.

## 2014-07-14 NOTE — Telephone Encounter (Signed)
Ca 125 result was 17 which was within the normal range. Family notified. [place]

## 2014-08-20 ENCOUNTER — Ambulatory Visit (INDEPENDENT_AMBULATORY_CARE_PROVIDER_SITE_OTHER): Payer: Medicare Other | Admitting: Otolaryngology

## 2014-08-20 DIAGNOSIS — H60333 Swimmer's ear, bilateral: Secondary | ICD-10-CM

## 2014-08-20 DIAGNOSIS — H6123 Impacted cerumen, bilateral: Secondary | ICD-10-CM

## 2014-10-22 ENCOUNTER — Ambulatory Visit (INDEPENDENT_AMBULATORY_CARE_PROVIDER_SITE_OTHER): Payer: Medicare Other | Admitting: Otolaryngology

## 2014-10-22 DIAGNOSIS — H628X1 Other disorders of right external ear in diseases classified elsewhere: Secondary | ICD-10-CM

## 2014-12-03 ENCOUNTER — Ambulatory Visit (INDEPENDENT_AMBULATORY_CARE_PROVIDER_SITE_OTHER): Payer: Medicare Other | Admitting: Otolaryngology

## 2014-12-03 DIAGNOSIS — H6121 Impacted cerumen, right ear: Secondary | ICD-10-CM

## 2014-12-03 DIAGNOSIS — H60391 Other infective otitis externa, right ear: Secondary | ICD-10-CM

## 2015-01-07 ENCOUNTER — Ambulatory Visit (INDEPENDENT_AMBULATORY_CARE_PROVIDER_SITE_OTHER): Payer: Medicare Other | Admitting: Otolaryngology

## 2015-01-07 DIAGNOSIS — H6121 Impacted cerumen, right ear: Secondary | ICD-10-CM | POA: Diagnosis not present

## 2015-01-07 DIAGNOSIS — H60391 Other infective otitis externa, right ear: Secondary | ICD-10-CM

## 2015-03-11 ENCOUNTER — Ambulatory Visit (INDEPENDENT_AMBULATORY_CARE_PROVIDER_SITE_OTHER): Payer: Medicare Other | Admitting: Otolaryngology

## 2015-04-29 ENCOUNTER — Ambulatory Visit (INDEPENDENT_AMBULATORY_CARE_PROVIDER_SITE_OTHER): Payer: Medicare Other | Admitting: Otolaryngology

## 2015-04-29 DIAGNOSIS — H60399 Other infective otitis externa, unspecified ear: Secondary | ICD-10-CM | POA: Diagnosis not present

## 2015-04-29 DIAGNOSIS — H6121 Impacted cerumen, right ear: Secondary | ICD-10-CM

## 2015-06-10 ENCOUNTER — Ambulatory Visit (INDEPENDENT_AMBULATORY_CARE_PROVIDER_SITE_OTHER): Payer: Medicare Other | Admitting: Otolaryngology

## 2015-06-10 DIAGNOSIS — H60391 Other infective otitis externa, right ear: Secondary | ICD-10-CM | POA: Diagnosis not present

## 2015-06-23 ENCOUNTER — Ambulatory Visit: Payer: Medicare Other | Admitting: Obstetrics and Gynecology

## 2015-06-24 ENCOUNTER — Other Ambulatory Visit (HOSPITAL_COMMUNITY): Payer: Self-pay | Admitting: Physician Assistant

## 2015-06-24 DIAGNOSIS — Z79899 Other long term (current) drug therapy: Secondary | ICD-10-CM

## 2015-06-24 DIAGNOSIS — Z1231 Encounter for screening mammogram for malignant neoplasm of breast: Secondary | ICD-10-CM

## 2015-06-30 ENCOUNTER — Ambulatory Visit (HOSPITAL_COMMUNITY): Payer: Medicare Other

## 2015-06-30 ENCOUNTER — Encounter: Payer: Self-pay | Admitting: Obstetrics and Gynecology

## 2015-06-30 ENCOUNTER — Ambulatory Visit (INDEPENDENT_AMBULATORY_CARE_PROVIDER_SITE_OTHER): Payer: Medicare Other | Admitting: Obstetrics and Gynecology

## 2015-06-30 ENCOUNTER — Other Ambulatory Visit (HOSPITAL_COMMUNITY): Payer: Medicare Other

## 2015-06-30 VITALS — BP 112/66 | Ht 62.0 in | Wt 130.0 lb

## 2015-06-30 DIAGNOSIS — M79605 Pain in left leg: Secondary | ICD-10-CM | POA: Diagnosis not present

## 2015-06-30 DIAGNOSIS — N83201 Unspecified ovarian cyst, right side: Secondary | ICD-10-CM | POA: Diagnosis not present

## 2015-06-30 NOTE — Progress Notes (Signed)
[facility] Visit  Patient name: Jaclyn Simpson MRN [id number]  Date of birth: 08-23-1917  CC & HPI:  Jaclyn Simpson Term[place] is a 81 y.o. female presenting today for followup. She states she has a h/o right ovarian cyst. She has increased pain in her left hip that radiates down into her left leg. She follows up with Rowan Blase, PA-C at [place] who gives her steroid injections in her left hip; but the pt states this has exacerbated the pain. Pt's niece reports that she is scheduled to have imaging done of her left to look for any bony abnormalities. She is taking her pain medication more frequent due to the pain in her left leg. Pt had an US done on 12/24/13 which showed that pt has 6cm x 7.6 cm simple cyst in right adnexa  ROS:  10 Systems reviewed and all are negative for acute change except as noted in the HPI.   Pertinent History Reviewed:   Reviewed: no PMHx Medical         Past Medical History  Diagnosis Date  . Cerebrovascular accident [employer]) 1975    Right brainstem  . Colles' fracture     right wrist  . Humerus fracture     proximal  . Syncope     Echo in 12/2010-mild to moderate LVH; normal EF; mild AS and AI  . Aortic valve stenosis   . Spinal stenosis   . Cavernous angioma     Pons/cerebellum  . Diverticulosis   . Anemia, iron deficiency     normal CBC in 2012  . Cyst of skin     right inner thigh                              Surgical Hx:    Past Surgical History  Procedure Laterality Date  . Total abdominal hysterectomy  1940s  . Thyroidectomy  1971    Goiter  . Lumbar spine surgery    . Colonoscopy  2001  . Cataract extraction      Bilateral  . Appendectomy  1940s  . Breast surgery     Medications: Reviewed & Updated - see associated section                       Current outpatient prescriptions:  .  alendronate (FOSAMAX) 70 MG tablet, Take 70 mg by mouth every 7 (seven) days. Take with a full glass of water on an empty stomach. ,  Disp: , Rfl:  .  Calcium Carbonate-Vitamin D (CALCIUM 600+D) 600-400 MG-UNIT tablet, Take 1 tablet by mouth daily., Disp: , Rfl:  .  fluticasone (FLONASE) 50 MCG/ACT nasal spray, Place into both nostrils daily., Disp: , Rfl:  .  furosemide (LASIX) 40 MG tablet, Take 20 mg by mouth daily. , Disp: , Rfl:  .  gabapentin (NEURONTIN) 300 MG capsule, Take 300 mg by mouth 2 (two) times daily.  , Disp: , Rfl:  .  HYDROcodone-acetaminophen (NORCO/VICODIN) 5-325 MG per tablet, , Disp: , Rfl:  .  levothyroxine (SYNTHROID, LEVOTHROID) 50 MCG tablet, Take 50 mcg by mouth daily before breakfast., Disp: , Rfl:  .  Multiple Vitamins-Minerals (V-C FORTE PO), Take 1 tablet by mouth daily.  , Disp: , Rfl:  .  neomycin-polymyxin-dexamethasone (MAXITROL) 0.1 % ophthalmic suspension, 1 drop 4 (four) times daily., Disp: , Rfl:  .  omeprazole (PRILOSEC) 20 MG  capsule, Take 20 mg by mouth daily.  , Disp: , Rfl:  .  polyethylene glycol (MIRALAX / GLYCOLAX) packet, Take 17 g by mouth daily., Disp: , Rfl:  .  Potassium Chloride (KLOR-CON PO), Take by mouth daily., Disp: , Rfl:  .  promethazine-codeine (PHENERGAN WITH CODEINE) 6.25-10 MG/5ML syrup, Take by mouth every 6 (six) hours as needed for cough., Disp: , Rfl:    Social History: Reviewed -  reports that she has never smoked. She has never used smokeless tobacco.  Objective Findings:  Vitals: Blood pressure 112/66, height 5\' 2"  (1.575 m), weight 130 lb (58.968 kg).  Physical Examination: not done; discussion only. Pt refuses pelvic exam  [place] on 12/24/13  6cm x 7.6 cm simple cyst in right adnexa   Assessment & Plan:   A:  1. Left leg pain unrelated to stable right ovarian cyst; not gyn origin 2. Right ovarian cyst stable, not source of current discomfort  P:  1. Pt asked for geriatrician, given name of Vivien Rossetti 2. Follow up as needed  By signing my name below, I, Erling Conte, attest that this documentation has been prepared under the direction and  in the presence of [name], MD. Electronically Signed: Erling Conte, ED Scribe. 06/30/2015. 11:34 AM.  I personally performed the services described in this documentation, which was SCRIBED in my presence. The recorded information has been reviewed and considered accurate. It has been edited as necessary during review. [name], MD

## 2015-06-30 NOTE — Progress Notes (Signed)
Patient ID: Jaclyn Simpson, female   DOB: 1917-02-15, 81 y.o.   MRN: [id number] Pt here today for follow up. Pt states that she has an ovarian cyst. Pt states that she has had increased pain in her left hip that radiates down her left leg. Pt has seen PCP for this and has received steroid injection but pain got worse not better. Pt is taking her pain medication more frequently due to the pain in her left leg.

## 2015-07-08 ENCOUNTER — Other Ambulatory Visit (HOSPITAL_COMMUNITY): Payer: Self-pay | Admitting: Physician Assistant

## 2015-07-08 ENCOUNTER — Ambulatory Visit (HOSPITAL_COMMUNITY)
Admission: RE | Admit: 2015-07-08 | Discharge: 2015-07-08 | Disposition: A | Discharge status: Home / Self Care | Payer: Medicare Other | Source: Ambulatory Visit | Attending: Physician Assistant | Admitting: Physician Assistant

## 2015-07-08 DIAGNOSIS — M25552 Pain in left hip: Secondary | ICD-10-CM | POA: Insufficient documentation

## 2015-07-08 DIAGNOSIS — Z79899 Other long term (current) drug therapy: Secondary | ICD-10-CM

## 2015-07-08 DIAGNOSIS — M160 Bilateral primary osteoarthritis of hip: Secondary | ICD-10-CM | POA: Insufficient documentation

## 2015-07-13 ENCOUNTER — Other Ambulatory Visit (HOSPITAL_COMMUNITY): Payer: Self-pay | Admitting: Physician Assistant

## 2015-07-13 ENCOUNTER — Ambulatory Visit (HOSPITAL_COMMUNITY)
Admission: RE | Admit: 2015-07-13 | Discharge: 2015-07-13 | Disposition: A | Discharge status: Home / Self Care | Payer: Medicare Other | Source: Ambulatory Visit | Attending: Physician Assistant | Admitting: Physician Assistant

## 2015-07-13 DIAGNOSIS — M549 Dorsalgia, unspecified: Secondary | ICD-10-CM

## 2015-07-13 DIAGNOSIS — R0602 Shortness of breath: Secondary | ICD-10-CM | POA: Diagnosis present

## 2015-07-13 DIAGNOSIS — M5134 Other intervertebral disc degeneration, thoracic region: Secondary | ICD-10-CM | POA: Insufficient documentation

## 2015-07-13 DIAGNOSIS — M40294 Other kyphosis, thoracic region: Secondary | ICD-10-CM | POA: Insufficient documentation

## 2015-07-13 DIAGNOSIS — I350 Nonrheumatic aortic (valve) stenosis: Secondary | ICD-10-CM | POA: Diagnosis not present

## 2015-07-16 ENCOUNTER — Other Ambulatory Visit (HOSPITAL_COMMUNITY): Payer: Self-pay | Admitting: Physician Assistant

## 2015-07-16 ENCOUNTER — Ambulatory Visit (HOSPITAL_COMMUNITY)
Admission: RE | Admit: 2015-07-16 | Discharge: 2015-07-16 | Disposition: A | Discharge status: Home / Self Care | Payer: Medicare Other | Source: Ambulatory Visit | Attending: Physician Assistant | Admitting: Physician Assistant

## 2015-07-16 DIAGNOSIS — M810 Age-related osteoporosis without current pathological fracture: Secondary | ICD-10-CM | POA: Insufficient documentation

## 2015-07-16 DIAGNOSIS — M1990 Unspecified osteoarthritis, unspecified site: Secondary | ICD-10-CM | POA: Insufficient documentation

## 2015-08-12 ENCOUNTER — Ambulatory Visit (INDEPENDENT_AMBULATORY_CARE_PROVIDER_SITE_OTHER): Payer: Medicare Other | Admitting: Otolaryngology

## 2015-08-12 DIAGNOSIS — H608X3 Other otitis externa, bilateral: Secondary | ICD-10-CM | POA: Diagnosis not present

## 2015-09-27 ENCOUNTER — Encounter (HOSPITAL_COMMUNITY): Payer: Self-pay | Admitting: *Deleted

## 2015-09-27 ENCOUNTER — Emergency Department (HOSPITAL_COMMUNITY)
Admission: EM | Admit: 2015-09-27 | Discharge: 2015-09-27 | Disposition: A | Discharge status: Skilled Nursing Facility | Payer: Medicare Other | Attending: Emergency Medicine | Admitting: Emergency Medicine

## 2015-09-27 ENCOUNTER — Emergency Department (HOSPITAL_COMMUNITY): Payer: Medicare Other

## 2015-09-27 DIAGNOSIS — Z79899 Other long term (current) drug therapy: Secondary | ICD-10-CM | POA: Diagnosis not present

## 2015-09-27 DIAGNOSIS — Z862 Personal history of diseases of the blood and blood-forming organs and certain disorders involving the immune mechanism: Secondary | ICD-10-CM | POA: Diagnosis not present

## 2015-09-27 DIAGNOSIS — Y9389 Activity, other specified: Secondary | ICD-10-CM | POA: Insufficient documentation

## 2015-09-27 DIAGNOSIS — Z8673 Personal history of transient ischemic attack (TIA), and cerebral infarction without residual deficits: Secondary | ICD-10-CM | POA: Insufficient documentation

## 2015-09-27 DIAGNOSIS — Y92129 Unspecified place in nursing home as the place of occurrence of the external cause: Secondary | ICD-10-CM | POA: Insufficient documentation

## 2015-09-27 DIAGNOSIS — M79602 Pain in left arm: Secondary | ICD-10-CM

## 2015-09-27 DIAGNOSIS — M79605 Pain in left leg: Secondary | ICD-10-CM | POA: Diagnosis not present

## 2015-09-27 DIAGNOSIS — Z8719 Personal history of other diseases of the digestive system: Secondary | ICD-10-CM | POA: Insufficient documentation

## 2015-09-27 DIAGNOSIS — Y998 Other external cause status: Secondary | ICD-10-CM | POA: Diagnosis not present

## 2015-09-27 DIAGNOSIS — W19XXXA Unspecified fall, initial encounter: Secondary | ICD-10-CM

## 2015-09-27 DIAGNOSIS — S0993XA Unspecified injury of face, initial encounter: Secondary | ICD-10-CM | POA: Diagnosis present

## 2015-09-27 DIAGNOSIS — W1839XA Other fall on same level, initial encounter: Secondary | ICD-10-CM | POA: Insufficient documentation

## 2015-09-27 DIAGNOSIS — G8929 Other chronic pain: Secondary | ICD-10-CM

## 2015-09-27 DIAGNOSIS — Z872 Personal history of diseases of the skin and subcutaneous tissue: Secondary | ICD-10-CM | POA: Diagnosis not present

## 2015-09-27 DIAGNOSIS — Z7951 Long term (current) use of inhaled steroids: Secondary | ICD-10-CM | POA: Insufficient documentation

## 2015-09-27 DIAGNOSIS — Z8781 Personal history of (healed) traumatic fracture: Secondary | ICD-10-CM | POA: Diagnosis not present

## 2015-09-27 DIAGNOSIS — S0083XA Contusion of other part of head, initial encounter: Secondary | ICD-10-CM | POA: Diagnosis not present

## 2015-09-27 MED ORDER — OXYCODONE-ACETAMINOPHEN 5-325 MG PO TABS
1.0000 | ORAL_TABLET | Freq: Once | ORAL | Status: AC
Start: 2015-09-27 — End: 2015-09-27
  Administered 2015-09-27: 1 via ORAL
  Filled 2015-09-27: qty 1

## 2015-09-27 NOTE — ED Provider Notes (Signed)
CSN: [id number]     Arrival date & time 09/27/15  [id number] History  By signing my name below, I, Terressa Koyanagi, attest that this documentation has been prepared under the direction and in the presence of Virgel Manifold, MD. Electronically Signed: Terressa Koyanagi, ED Scribe. 09/27/2015. 8:26 AM.  Chief Complaint  Patient presents with  . Fall  . Leg Pain   The history is provided by the patient and the nursing home. No language interpreter was used.   PCP: Purvis Kilts, MD HPI Comments: Jaclyn Simpson is a 82 y.o. female, with PMHx noted below, who presents to the Emergency Department from [facility] complaining of an witnessed fall this morning. Pt denies head trauma or LOC. Pt reports she fell on her right side, however, denies any pain on that side. Pt denies any other associated acute pain. However, pt complains of chronic burning pain in her left leg onset years ago following a stroke. Pt denies headache, neck pain, facial pain.  During exam BP is 143/70.   Past Medical History  Diagnosis Date  . Cerebrovascular accident [facility]) 1975    Right brainstem  . Colles' fracture     right wrist  . Humerus fracture     proximal  . Syncope     Echo in 12/2010-mild to moderate LVH; normal EF; mild AS and AI  . Aortic valve stenosis   . Spinal stenosis   . Cavernous angioma     Pons/cerebellum  . Diverticulosis   . Anemia, iron deficiency     normal CBC in 2012  . Cyst of skin     right inner thigh   Past Surgical History  Procedure Laterality Date  . Total abdominal hysterectomy  1940s  . Thyroidectomy  1971    Goiter  . Lumbar spine surgery    . Colonoscopy  2001  . Cataract extraction      Bilateral  . Appendectomy  1940s  . Breast surgery     Family History  Problem Relation Age of Onset  . Heart disease Father   . Stroke Mother   . Pneumonia Brother     x2 brothers  . Heart attack Brother   . Cancer Sister     brain tumor   Social History  Substance Use Topics   . Smoking status: Never Smoker   . Smokeless tobacco: Never Used  . Alcohol Use: No   OB History    No data available     Review of Systems  Musculoskeletal: Positive for arthralgias (left leg pain). Negative for neck pain.  Neurological: Negative for speech difficulty and headaches.  All other systems reviewed and are negative.  Allergies  Review of patient's allergies indicates no known allergies.  Home Medications   Prior to Admission medications   Medication Sig Start Date End Date Taking? Authorizing Provider  alendronate (FOSAMAX) 70 MG tablet Take 70 mg by mouth every 7 (seven) days. Take with a full glass of water on an empty stomach.     Historical Provider, MD  Calcium Carbonate-Vitamin D (CALCIUM 600+D) 600-400 MG-UNIT tablet Take 1 tablet by mouth daily.    Historical Provider, MD  fluticasone (FLONASE) 50 MCG/ACT nasal spray Place into both nostrils daily.    Historical Provider, MD  furosemide (LASIX) 40 MG tablet Take 20 mg by mouth daily.     Historical Provider, MD  gabapentin (NEURONTIN) 300 MG capsule Take 300 mg by mouth 2 (two) times daily.  Historical Provider, MD  HYDROcodone-acetaminophen (NORCO/VICODIN) 5-325 MG per tablet  05/26/14   Historical Provider, MD  levothyroxine (SYNTHROID, LEVOTHROID) 50 MCG tablet Take 50 mcg by mouth daily before breakfast.    Historical Provider, MD  Multiple Vitamins-Minerals (V-C FORTE PO) Take 1 tablet by mouth daily.      Historical Provider, MD  neomycin-polymyxin-dexamethasone (MAXITROL) 0.1 % ophthalmic suspension 1 drop 4 (four) times daily.    Historical Provider, MD  omeprazole (PRILOSEC) 20 MG capsule Take 20 mg by mouth daily.      Historical Provider, MD  polyethylene glycol (MIRALAX / GLYCOLAX) packet Take 17 g by mouth daily.    Historical Provider, MD  Potassium Chloride (KLOR-CON PO) Take by mouth daily.    Historical Provider, MD  promethazine-codeine (PHENERGAN WITH CODEINE) 6.25-10 MG/5ML syrup Take by  mouth every 6 (six) hours as needed for cough.    Historical Provider, MD   Triage Vitals: BP 144/106 mmHg  Pulse 72  Temp(Src) 97.6 F (36.4 C) (Oral)  Resp 18  Ht 5\' 1"  (1.549 m)  Wt 128 lb (58.06 kg)  BMI 24.20 kg/m2  SpO2 97% Physical Exam  Constitutional: She is oriented to person, place, and time. She appears well-developed and well-nourished.  HENT:  Head: Normocephalic and atraumatic.  Subacute bruising to left face  Eyes: Conjunctivae and EOM are normal. Pupils are equal, round, and reactive to light.  Neck: Normal range of motion. Neck supple.  Cardiovascular: Normal rate and regular rhythm.   Pulmonary/Chest: Effort normal and breath sounds normal.  Abdominal: Soft. Bowel sounds are normal.  Musculoskeletal: Normal range of motion.  Neurological: She is alert and oriented to person, place, and time. She has normal strength. No cranial nerve deficit or sensory deficit.  Skin: Skin is warm and dry.  Psychiatric: She has a normal mood and affect. Her behavior is normal.  Nursing note and vitals reviewed.   ED Course  Procedures (including critical care time) DIAGNOSTIC STUDIES: Oxygen Saturation is 97% on ra, nl by my interpretation.    COORDINATION OF CARE: 8:05 AM: Discussed treatment plan which includes imaging, with pt at bedside; patient verbalizes understanding and agrees with treatment plan.  Imaging Review No results found.  Ct Head Wo Contrast  09/27/2015  CLINICAL DATA:  Fall. Forehead bruising from recent fall. Initial encounter. EXAM: CT HEAD WITHOUT CONTRAST TECHNIQUE: Contiguous axial images were obtained from the base of the skull through the vertex without intravenous contrast. COMPARISON:  None. FINDINGS: Skull and Sinuses:Negative for acute fracture or destructive process. 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 There is atrophy of right muscles of mastication with no evidence of skullbase mass or foraminal erosion. Question relationship to right  pons lesion. Visualized orbits: Negative. Brain: No evidence of acute infarction, hemorrhage, hydrocephalus, swelling. There is a 1 cm area of stippled calcification in the right posterior pons. This appearance can be seen from chronic ischemia or hemorrhage related to developmental venous anomaly or cavernoma. There is no surrounding edema or mass effect. Normal cerebral volume. Mild/age congruent small-vessel ischemic change in the deep cerebral white matter. IMPRESSION: 1. No evidence of intracranial injury or fracture. 2. Incidental findings described above. Electronically Signed   By: Monte Fantasia M.D.   On: 09/27/2015 09:08   I have personally reviewed and evaluated these images as part of my medical decision-making.   MDM   Final diagnoses:  Fall, initial encounter  Facial contusion, initial encounter  Chronic leg pain, left  I personally preformed the services scribed in my presence. The recorded information has been reviewed is accurate. Virgel Manifold, MD.    Virgel Manifold, MD 10/06/15 2010

## 2015-09-27 NOTE — Discharge Instructions (Signed)

## 2015-09-27 NOTE — ED Notes (Signed)
Pt had a witnessed fall at [facility] this morning, staff denies her hitting her head or losing consciousness. Pt fell on her right side, denies any pain on that side. Pt has pain in her left leg, this is her norm. Pt has a DNR at bedside.

## 2015-10-18 ENCOUNTER — Inpatient Hospital Stay (HOSPITAL_COMMUNITY)
Admission: EM | Admit: 2015-10-18 | Discharge: 2015-10-21 | DRG: 640 | Disposition: A | Discharge status: Home Health | Payer: Medicare Other | Attending: Internal Medicine | Admitting: Internal Medicine

## 2015-10-18 ENCOUNTER — Emergency Department (HOSPITAL_COMMUNITY): Payer: Medicare Other

## 2015-10-18 ENCOUNTER — Encounter (HOSPITAL_COMMUNITY): Payer: Self-pay | Admitting: Emergency Medicine

## 2015-10-18 DIAGNOSIS — Z8249 Family history of ischemic heart disease and other diseases of the circulatory system: Secondary | ICD-10-CM

## 2015-10-18 DIAGNOSIS — E039 Hypothyroidism, unspecified: Secondary | ICD-10-CM | POA: Diagnosis present

## 2015-10-18 DIAGNOSIS — E785 Hyperlipidemia, unspecified: Secondary | ICD-10-CM | POA: Diagnosis present

## 2015-10-18 DIAGNOSIS — Z809 Family history of malignant neoplasm, unspecified: Secondary | ICD-10-CM

## 2015-10-18 DIAGNOSIS — E860 Dehydration: Secondary | ICD-10-CM

## 2015-10-18 DIAGNOSIS — E871 Hypo-osmolality and hyponatremia: Secondary | ICD-10-CM | POA: Diagnosis not present

## 2015-10-18 DIAGNOSIS — G92 Toxic encephalopathy: Secondary | ICD-10-CM | POA: Diagnosis present

## 2015-10-18 DIAGNOSIS — R339 Retention of urine, unspecified: Secondary | ICD-10-CM | POA: Diagnosis present

## 2015-10-18 DIAGNOSIS — E873 Alkalosis: Secondary | ICD-10-CM

## 2015-10-18 DIAGNOSIS — N83201 Unspecified ovarian cyst, right side: Secondary | ICD-10-CM | POA: Diagnosis present

## 2015-10-18 DIAGNOSIS — Z79891 Long term (current) use of opiate analgesic: Secondary | ICD-10-CM

## 2015-10-18 DIAGNOSIS — R338 Other retention of urine: Secondary | ICD-10-CM

## 2015-10-18 DIAGNOSIS — R4182 Altered mental status, unspecified: Secondary | ICD-10-CM | POA: Diagnosis not present

## 2015-10-18 DIAGNOSIS — I350 Nonrheumatic aortic (valve) stenosis: Secondary | ICD-10-CM

## 2015-10-18 DIAGNOSIS — Z90710 Acquired absence of both cervix and uterus: Secondary | ICD-10-CM

## 2015-10-18 DIAGNOSIS — Z823 Family history of stroke: Secondary | ICD-10-CM

## 2015-10-18 DIAGNOSIS — K219 Gastro-esophageal reflux disease without esophagitis: Secondary | ICD-10-CM | POA: Diagnosis present

## 2015-10-18 DIAGNOSIS — R627 Adult failure to thrive: Secondary | ICD-10-CM | POA: Diagnosis present

## 2015-10-18 DIAGNOSIS — Z66 Do not resuscitate: Secondary | ICD-10-CM | POA: Diagnosis present

## 2015-10-18 DIAGNOSIS — E876 Hypokalemia: Secondary | ICD-10-CM

## 2015-10-18 DIAGNOSIS — Z8673 Personal history of transient ischemic attack (TIA), and cerebral infarction without residual deficits: Secondary | ICD-10-CM

## 2015-10-18 DIAGNOSIS — E878 Other disorders of electrolyte and fluid balance, not elsewhere classified: Secondary | ICD-10-CM

## 2015-10-18 DIAGNOSIS — L03116 Cellulitis of left lower limb: Secondary | ICD-10-CM | POA: Diagnosis present

## 2015-10-18 LAB — CBC WITH DIFFERENTIAL/PLATELET
BASOS ABS: 0 10*3/uL (ref 0.0–0.1)
BASOS PCT: 0 %
Eosinophils Absolute: 0 10*3/uL (ref 0.0–0.7)
Eosinophils Relative: 0 %
HCT: 41.9 % (ref 36.0–46.0)
HEMOGLOBIN: 14.7 g/dL (ref 12.0–15.0)
LYMPHS PCT: 3 %
Lymphs Abs: 0.5 10*3/uL — ABNORMAL LOW (ref 0.7–4.0)
MCH: 30.8 pg (ref 26.0–34.0)
MCHC: 35.1 g/dL (ref 30.0–36.0)
MCV: 87.8 fL (ref 78.0–100.0)
MONO ABS: 1.4 10*3/uL — AB (ref 0.1–1.0)
Monocytes Relative: 7 %
NEUTROS PCT: 90 %
Neutro Abs: 17.5 10*3/uL — ABNORMAL HIGH (ref 1.7–7.7)
Platelets: 294 10*3/uL (ref 150–400)
RBC: 4.77 MIL/uL (ref 3.87–5.11)
RDW: 13.4 % (ref 11.5–15.5)
WBC: 19.4 10*3/uL — ABNORMAL HIGH (ref 4.0–10.5)

## 2015-10-18 MED ORDER — SODIUM CHLORIDE 0.9 % IV BOLUS (SEPSIS)
1000.0000 mL | Freq: Once | INTRAVENOUS | Status: AC
  Administered 2015-10-18: 1000 mL via INTRAVENOUS

## 2015-10-18 NOTE — ED Notes (Signed)
Pt from [facility] with AMS. Per staff there, pt has not been eating or drinking much over last week and pt is normally able to feed and do for self and per staff, cannot do this. Transported here by EMS. CBG 184.

## 2015-10-18 NOTE — ED Provider Notes (Signed)
CSN: [id number]     Arrival date & time 10/18/15  2223 History  By signing my name below, I, [facility], attest that this documentation has been prepared under the direction and in the presence of Rolland Porter, MD at 2315. Electronically Signed: Virgel Bouquet, ED Scribe. 10/18/2015. 1:25 AM.   Chief Complaint  Patient presents with  . Altered Mental Status   The history is provided by a relative. The history is limited by the condition of the patient. No language interpreter was used.   LEVEL V CAVEAT DUE TO ALTERED MENTAL STATUS  HPI Comments: Jaclyn Simpson is a 82 y.o. female brought in by her niece who presents to the Emergency Department complaining of constant, mild onset of worsening altered mental status that began 1 week ago. Niece reports that, pt stays at [facility] and has been falling frequently, twice last week and once 3 weeks ago where she struck her head. Niece states that the patient has had a change in demeanor, has been using a wheelchair (normally ambulates with a walker), eyes "did not look normal"  for the past week, and has refused to eat for the past 3 days. She is drinking water. She endorses right leg swelling , confusion, change in speech, left leg pain but notes that this pain is chronic. She has been drinking water but has refused to eat other than 2 milkshakes in the past 3 days. She reports she recently had an ulcer and cellulitis on her left lower leg which is improved. She also had some swelling of her right leg and they did increase her fluid pill recently at the assisted living facility. Hx of ovarian cyst. Compliant with all medications up to 2 weeks ago. Denies any other symptoms currently. They also state the patient thinks she has colon cancer without reason. Niece states patient normally is very mentally alert and her speech is usually very clear. Patient also has hearing loss and did not bring her hearing aids.  Patient has been  seen by Dr. Glo Herring lately for a right ovarian cyst.  PCP: Dr. Sharilyn Sites and Merrily Brittle, NP at the ALF  Patient is DO NOT RESUSCITATE   Past Medical History  Diagnosis Date  . Cerebrovascular accident [facility]) 1975    Right brainstem  . Colles' fracture     right wrist  . Humerus fracture     proximal  . Syncope     Echo in 12/2010-mild to moderate LVH; normal EF; mild AS and AI  . Aortic valve stenosis   . Spinal stenosis   . Cavernous angioma     Pons/cerebellum  . Diverticulosis   . Anemia, iron deficiency     normal CBC in 2012  . Cyst of skin     right inner thigh   Past Surgical History  Procedure Laterality Date  . Total abdominal hysterectomy  1940s  . Thyroidectomy  1971    Goiter  . Lumbar spine surgery    . Colonoscopy  2001  . Cataract extraction      Bilateral  . Appendectomy  1940s  . Breast surgery     Family History  Problem Relation Age of Onset  . Heart disease Father   . Stroke Mother   . Pneumonia Brother     x2 brothers  . Heart attack Brother   . Cancer Sister     brain tumor   Social History  Substance Use Topics  . Smoking status:  Never Smoker   . Smokeless tobacco: Never Used  . Alcohol Use: No   Lives in ALF Uses a walker/wheelchair  OB History    No data available     Review of Systems  Unable to perform ROS: Acuity of condition  Cardiovascular: Positive for leg swelling.  Neurological: Positive for weakness.  Psychiatric/Behavioral: Positive for confusion.  All other systems reviewed and are negative.     Allergies  Review of patient's allergies indicates no known allergies.  Home Medications   Prior to Admission medications   Medication Sig Start Date End Date Taking? Authorizing Provider  alendronate (FOSAMAX) 70 MG tablet Take 70 mg by mouth every 7 (seven) days. Take with a full glass of water on an empty stomach. Take on Mondays   Yes Historical Provider, MD  Calcium Carbonate-Vitamin D (CALCIUM  600+D) 600-400 MG-UNIT tablet Take 1 tablet by mouth daily.   Yes Historical Provider, MD  clotrimazole-betamethasone (LOTRISONE) cream Apply 1 application topically 2 (two) times daily.   Yes Historical Provider, MD  cyanocobalamin (,VITAMIN B-12,) 1000 MCG/ML injection Inject 1,000 mcg into the muscle every 30 (thirty) days.    Yes Historical Provider, MD  furosemide (LASIX) 40 MG tablet Take 80 mg by mouth 2 (two) times daily.    Yes Historical Provider, MD  gabapentin (NEURONTIN) 300 MG capsule Take 300 mg by mouth 2 (two) times daily.     Yes Historical Provider, MD  HYDROcodone-acetaminophen (NORCO/VICODIN) 5-325 MG per tablet Take 1 tablet by mouth 3 (three) times daily.  05/26/14  Yes Historical Provider, MD  levothyroxine (SYNTHROID, LEVOTHROID) 50 MCG tablet Take 50 mcg by mouth daily before breakfast.   Yes Historical Provider, MD  lubiprostone (AMITIZA) 8 MCG capsule Take 8 mcg by mouth daily.    Yes Historical Provider, MD  magnesium hydroxide (MILK OF MAGNESIA) 400 MG/5ML suspension Take 30 mLs by mouth 2 (two) times daily as needed for mild constipation.   Yes Historical Provider, MD  metolazone (ZAROXOLYN) 2.5 MG tablet Take 2.5 mg by mouth daily. To  Take 30 minutes prior to Lasix (Furosemide)   Yes Historical Provider, MD  Multiple Vitamins-Minerals (ICAPS AREDS 2 PO) Take 1 tablet by mouth daily.   Yes Historical Provider, MD  mupirocin ointment (BACTROBAN) 2 % Apply 1 application topically 3 (three) times daily as needed.   Yes Historical Provider, MD  omeprazole (PRILOSEC) 20 MG capsule Take 20 mg by mouth daily.     Yes Historical Provider, MD  polycarbophil (FIBER-LAX) 625 MG tablet Take 1,250 mg by mouth daily.   Yes Historical Provider, MD  polyethylene glycol (MIRALAX / GLYCOLAX) packet Take 17 g by mouth daily.   Yes Historical Provider, MD  promethazine-codeine (PHENERGAN WITH CODEINE) 6.25-10 MG/5ML syrup Take by mouth every 6 (six) hours as needed for cough.   Yes  Historical Provider, MD  senna-docusate (SENOKOT-S) 8.6-50 MG tablet Take 1 tablet by mouth daily.   Yes Historical Provider, MD  simvastatin (ZOCOR) 20 MG tablet Take 1 tablet by mouth at bedtime. 09/23/15  Yes Historical Provider, MD  cephALEXin (KEFLEX) 250 MG capsule Take 250 mg by mouth 4 (four) times daily. Reported on 10/18/2015    Historical Provider, MD   BP 113/51 mmHg  Pulse 50  Temp(Src) 97.9 F (36.6 C) (Oral)  Resp 23  SpO2 95%  Vital signs normal except for bradycardia  Physical Exam  Constitutional: She is oriented to person, place, and time. She appears well-developed and well-nourished.  Non-toxic  appearance. She does not appear ill. No distress.  Elderly female. Hard of hearing but follows commands.  HENT:  Head: Normocephalic and atraumatic.  Right Ear: External ear normal.  Left Ear: External ear normal.  Nose: Nose normal. No mucosal edema or rhinorrhea.  Mouth/Throat: Oropharynx is clear and moist and mucous membranes are normal. No dental abscesses or uvula swelling.  Eyes: Conjunctivae and EOM are normal. Pupils are equal, round, and reactive to light.  Neck: Normal range of motion and full passive range of motion without pain. Neck supple.  Cardiovascular: Normal rate, regular rhythm and normal heart sounds.  Exam reveals no gallop and no friction rub.   No murmur heard. Pulmonary/Chest: Effort normal and breath sounds normal. No respiratory distress. She has no wheezes. She has no rhonchi. She has no rales. She exhibits no tenderness and no crepitus.  Abdominal: Soft. Normal appearance and bowel sounds are normal. She exhibits distension. There is no tenderness. There is no rebound and no guarding.  Abdomen is distended and has a hard mass in her lower abdomen that appears to be non-tender.  Musculoskeletal: Normal range of motion. She exhibits no edema or tenderness.  Moves all extremities well.   Neurological: She is alert and oriented to person, place, and  time. She has normal strength. No cranial nerve deficit.  Skin: Skin is warm, dry and intact. No rash noted. No erythema. No pallor.  Psychiatric: She has a normal mood and affect. Her speech is normal and behavior is normal. Her mood appears not anxious.  Nursing note and vitals reviewed.   ED Course  Procedures (include critical care time)  Medications  potassium chloride 10 mEq in 100 mL IVPB (10 mEq Intravenous New Bag/Given 10/19/15 0115)  sodium chloride 0.9 % bolus 1,000 mL (0 mLs Intravenous Stopped 10/19/15 0114)  sodium chloride 0.9 % bolus 1,000 mL (1,000 mLs Intravenous New Bag/Given 10/19/15 0114)     DIAGNOSTIC STUDIES: Oxygen Saturation is 96% on 2 L/min, adequate by my interpretation.    COORDINATION OF CARE: 11:17 PM Discussed treatment plan with pt and family at bedside and pt agreed to plan. Patient was given a liter of IV fluids for her dehydration that was apparent on physical exam.  Bladder scan was done to evaluate this mass in her lower abdomen. It revealed her bladder had over 1000 mL a urine in it. Foley was inserted and patient did indeed have over 1000 mL of dark yellow urine  12:26 AM Returned to discuss results of labs. Patient was noted to have a severe hyponatremia and low potassium. She was started on IV potassium 10 mEq boluses 4 to start and placed on cardiac monitor. She was given a second liter of normal saline. I discussed with the family her test results showing that she has severe dehydration. Her niece states that due to swelling in her right leg she had her Lasix increased recently which may be the etiology of her laboratory abnormalities. I discussed also she would need admission once we got the results of her CT scan of her head.  01:18 Dr Marin Comment will see patient and admit and do bed request.   Labs Review Results for orders placed or performed during the hospital encounter of 10/18/15  Comprehensive metabolic panel  Result Value Ref Range   Sodium  118 (LL) 135 - 145 mmol/L   Potassium <2.0 (LL) 3.5 - 5.1 mmol/L   Chloride <65 (LL) 101 - 111 mmol/L   CO2 52 (H)  22 - 32 mmol/L   Glucose, Bld 144 (H) 65 - 99 mg/dL   BUN 57 (H) 6 - 20 mg/dL   Creatinine, Ser 0.99 0.44 - 1.00 mg/dL   Calcium 8.6 (L) 8.9 - 10.3 mg/dL   Total Protein 7.0 6.5 - 8.1 g/dL   Albumin 4.0 3.5 - 5.0 g/dL   AST 31 15 - 41 U/L   ALT 18 14 - 54 U/L   Alkaline Phosphatase 85 38 - 126 U/L   Total Bilirubin 1.9 (H) 0.3 - 1.2 mg/dL   GFR calc non Af Amer 47 (L) >60 mL/min   GFR calc Af Amer 54 (L) >60 mL/min  CBC with Differential  Result Value Ref Range   WBC 19.4 (H) 4.0 - 10.5 K/uL   RBC 4.77 3.87 - 5.11 MIL/uL   Hemoglobin 14.7 12.0 - 15.0 g/dL   HCT 41.9 36.0 - 46.0 %   MCV 87.8 78.0 - 100.0 fL   MCH 30.8 26.0 - 34.0 pg   MCHC 35.1 30.0 - 36.0 g/dL   RDW 13.4 11.5 - 15.5 %   Platelets 294 150 - 400 K/uL   Neutrophils Relative % 90 %   Neutro Abs 17.5 (H) 1.7 - 7.7 K/uL   Lymphocytes Relative 3 %   Lymphs Abs 0.5 (L) 0.7 - 4.0 K/uL   Monocytes Relative 7 %   Monocytes Absolute 1.4 (H) 0.1 - 1.0 K/uL   Eosinophils Relative 0 %   Eosinophils Absolute 0.0 0.0 - 0.7 K/uL   Basophils Relative 0 %   Basophils Absolute 0.0 0.0 - 0.1 K/uL  Urinalysis, Routine w reflex microscopic  Result Value Ref Range   Color, Urine YELLOW YELLOW   APPearance CLEAR CLEAR   Specific Gravity, Urine <1.005 (L) 1.005 - 1.030   pH 6.5 5.0 - 8.0   Glucose, UA NEGATIVE NEGATIVE mg/dL   Hgb urine dipstick NEGATIVE NEGATIVE   Bilirubin Urine NEGATIVE NEGATIVE   Ketones, ur NEGATIVE NEGATIVE mg/dL   Protein, ur NEGATIVE NEGATIVE mg/dL   Nitrite NEGATIVE NEGATIVE   Leukocytes, UA NEGATIVE NEGATIVE  Magnesium  Result Value Ref Range   Magnesium 3.2 (H) 1.7 - 2.4 mg/dL   Laboratory interpretation all normal except severe hyponatremia, hypokalemia, low chloride, with elevated bicarbonate, all consistent with severe dehydration, magnesium is elevated    Imaging Review Ct  Head Wo Contrast  10/19/2015  CLINICAL DATA:  Altered mental status and multiple falls for a few days. History of syncope, cavernous angioma and stroke. EXAM: CT HEAD WITHOUT CONTRAST TECHNIQUE: Contiguous axial images were obtained from the base of the skull through the vertex without intravenous contrast. COMPARISON:  CT head September 27, 2015 FINDINGS: The ventricles and sulci are normal for age. No intraparenchymal hemorrhage, mass effect nor midline shift. Patchy supratentorial white matter hypodensities are less than expected for patient's age and though non-specific suggest sequelae of chronic small vessel ischemic disease. No acute large vascular territory infarcts. No abnormal extra-axial fluid collections. Basal cisterns are patent. Moderate calcific atherosclerosis of the carotid siphons. 1 cm heterogeneously calcified mass RIGHT pons. No mass effect. No skull fracture. 1 cm lucency LEFT parietal cortex favoring vascular [place] Osteopenia. Again noted is asymmetric RIGHT muscles of mastication atrophy. The included ocular globes and orbital contents are non-suspicious. Status post bilateral ocular lens implants. Minimal RIGHT mastoid effusion. LEFT mastoid air cells are well aerated. Imaged paranasal sinuses are well-aerated. IMPRESSION: No acute intracranial process. Stable appearance of the head from September 27, 2015  including probable cavernoma RIGHT pons. Electronically Signed   By: Elon Alas M.D.   On: 10/19/2015 00:48   Dg Abd 2 Views  10/19/2015  CLINICAL DATA:  82 year old female with altered mental status and loss of appetite. EXAM: ABDOMEN - 2 VIEW COMPARISON:  Radiograph dated 07/16/2015 FINDINGS: The visualized portion of the lungs demonstrate emphysematous changes with bibasilar atelectasis/ scarring. No evidence of bowel obstruction. No free air. There is osteopenia with degenerative changes of the spine. No definite acute fracture. IMPRESSION: No bowel obstruction. Electronically  Signed   By: Anner Crete M.D.   On: 10/19/2015 00:13   I have personally reviewed and evaluated these images and lab results as part of my medical decision-making.   EKG Interpretation   Date/Time:  Monday October 18 2015 22:51:05 EST Ventricular Rate:  61 PR Interval:  165 QRS Duration: 177 QT Interval:  521 QTC Calculation: 525 R Axis:   -176 Text Interpretation:  Sinus rhythm Probable left atrial enlargement Since  last tracing 31 Jan 2007 RBBB and LPFB Probable anterolateral infarct, old  Confirmed by Baxter Gonzalez  MD-I, Gearald Stonebraker ([id number]) on 10/18/2015 11:02:01 PM   #2  EKG Interpretation  Date/Time:  Tuesday October 19 2015 01:07:56 EST Ventricular Rate:  59 PR Interval:  171 QRS Duration: 188 QT Interval:  544 QTC Calculation: 539 R Axis:   118 Text Interpretation:  Sinus rhythm Ventricular trigeminy Probable left atrial enlargement RBBB and LPFB Abnormal T, consider ischemia, inferior leads Since last tracing EARLIER SAME DATE ventricular trigeminy now present Confirmed by Julitza Rickles  MD-I, Fantasha Daniele ([id number]) on 10/19/2015 1:22:18 AM          MDM   Final diagnoses:  Altered mental status, unspecified altered mental status type  Acute urinary retention  Hyponatremia  Serum chloride decreased  Hypokalemia  Hypermagnesemia  Dehydration  Metabolic alkalosis   Plan admission  Rolland Porter, MD, FACEP  CRITICAL CARE Performed by: Rolland Porter L Total critical care time: 35  minutes Critical care time was exclusive of separately billable procedures and treating other patients. Critical care was necessary to treat or prevent imminent or life-threatening deterioration. Critical care was time spent personally by me on the following activities: development of treatment plan with patient and/or surrogate as well as nursing, discussions with consultants, evaluation of patient's response to treatment, examination of patient, obtaining history from patient or surrogate, ordering and performing  treatments and interventions, ordering and review of laboratory studies, ordering and review of radiographic studies, pulse oximetry and re-evaluation of patient's condition.    I personally performed the services described in this documentation, which was scribed in my presence. The recorded information has been reviewed and considered.  Rolland Porter, MD, Barbette Or, MD 10/19/15 [phone]

## 2015-10-18 NOTE — ED Notes (Signed)
Room air O2 at 86 - 88% placed on 2L O2 by nasal cannula.

## 2015-10-18 NOTE — ED Notes (Signed)
Family states pt not eating well over the past few weeks. Says thinks speeched has been more slurred over the past few weeks also. No mention from staff. Pt does not have her hearing aids & family thinks ears have wax in them as before. Pt is alert & oriented x4.

## 2015-10-19 ENCOUNTER — Encounter (HOSPITAL_COMMUNITY): Payer: Self-pay

## 2015-10-19 DIAGNOSIS — Z8673 Personal history of transient ischemic attack (TIA), and cerebral infarction without residual deficits: Secondary | ICD-10-CM | POA: Diagnosis not present

## 2015-10-19 DIAGNOSIS — E785 Hyperlipidemia, unspecified: Secondary | ICD-10-CM

## 2015-10-19 DIAGNOSIS — R338 Other retention of urine: Secondary | ICD-10-CM | POA: Insufficient documentation

## 2015-10-19 DIAGNOSIS — E860 Dehydration: Secondary | ICD-10-CM | POA: Diagnosis not present

## 2015-10-19 DIAGNOSIS — E871 Hypo-osmolality and hyponatremia: Secondary | ICD-10-CM | POA: Diagnosis present

## 2015-10-19 DIAGNOSIS — Z79891 Long term (current) use of opiate analgesic: Secondary | ICD-10-CM | POA: Diagnosis not present

## 2015-10-19 DIAGNOSIS — I350 Nonrheumatic aortic (valve) stenosis: Secondary | ICD-10-CM | POA: Diagnosis present

## 2015-10-19 DIAGNOSIS — E039 Hypothyroidism, unspecified: Secondary | ICD-10-CM | POA: Diagnosis present

## 2015-10-19 DIAGNOSIS — E876 Hypokalemia: Secondary | ICD-10-CM | POA: Diagnosis present

## 2015-10-19 DIAGNOSIS — L03116 Cellulitis of left lower limb: Secondary | ICD-10-CM | POA: Diagnosis present

## 2015-10-19 DIAGNOSIS — R4182 Altered mental status, unspecified: Secondary | ICD-10-CM | POA: Diagnosis present

## 2015-10-19 DIAGNOSIS — R627 Adult failure to thrive: Secondary | ICD-10-CM | POA: Diagnosis present

## 2015-10-19 DIAGNOSIS — E873 Alkalosis: Secondary | ICD-10-CM | POA: Diagnosis not present

## 2015-10-19 DIAGNOSIS — K219 Gastro-esophageal reflux disease without esophagitis: Secondary | ICD-10-CM | POA: Diagnosis present

## 2015-10-19 DIAGNOSIS — G92 Toxic encephalopathy: Secondary | ICD-10-CM | POA: Diagnosis present

## 2015-10-19 DIAGNOSIS — Z66 Do not resuscitate: Secondary | ICD-10-CM | POA: Diagnosis not present

## 2015-10-19 DIAGNOSIS — Z90710 Acquired absence of both cervix and uterus: Secondary | ICD-10-CM | POA: Diagnosis not present

## 2015-10-19 DIAGNOSIS — Z8249 Family history of ischemic heart disease and other diseases of the circulatory system: Secondary | ICD-10-CM | POA: Diagnosis not present

## 2015-10-19 DIAGNOSIS — N83201 Unspecified ovarian cyst, right side: Secondary | ICD-10-CM | POA: Diagnosis present

## 2015-10-19 DIAGNOSIS — Z809 Family history of malignant neoplasm, unspecified: Secondary | ICD-10-CM | POA: Diagnosis not present

## 2015-10-19 DIAGNOSIS — E878 Other disorders of electrolyte and fluid balance, not elsewhere classified: Secondary | ICD-10-CM | POA: Diagnosis present

## 2015-10-19 DIAGNOSIS — Z823 Family history of stroke: Secondary | ICD-10-CM | POA: Diagnosis not present

## 2015-10-19 DIAGNOSIS — R339 Retention of urine, unspecified: Secondary | ICD-10-CM | POA: Diagnosis present

## 2015-10-19 LAB — COMPREHENSIVE METABOLIC PANEL
ALBUMIN: 3.3 g/dL — AB (ref 3.5–5.0)
ALBUMIN: 4 g/dL (ref 3.5–5.0)
ALK PHOS: 71 U/L (ref 38–126)
ALT: 15 U/L (ref 14–54)
ALT: 18 U/L (ref 14–54)
AST: 26 U/L (ref 15–41)
AST: 31 U/L (ref 15–41)
Alkaline Phosphatase: 85 U/L (ref 38–126)
BILIRUBIN TOTAL: 1.3 mg/dL — AB (ref 0.3–1.2)
BILIRUBIN TOTAL: 1.9 mg/dL — AB (ref 0.3–1.2)
BUN: 44 mg/dL — AB (ref 6–20)
BUN: 57 mg/dL — AB (ref 6–20)
CALCIUM: 8.6 mg/dL — AB (ref 8.9–10.3)
CO2: 49 mmol/L — ABNORMAL HIGH (ref 22–32)
CO2: 52 mmol/L — ABNORMAL HIGH (ref 22–32)
CREATININE: 0.79 mg/dL (ref 0.44–1.00)
Calcium: 7.4 mg/dL — ABNORMAL LOW (ref 8.9–10.3)
Creatinine, Ser: 0.99 mg/dL (ref 0.44–1.00)
GFR calc Af Amer: 54 mL/min — ABNORMAL LOW (ref >60)
GFR calc Af Amer: >60 mL/min (ref >60)
GFR calc non Af Amer: >60 mL/min (ref >60)
GFR, EST NON AFRICAN AMERICAN: 47 mL/min — AB (ref >60)
GLUCOSE: 128 mg/dL — AB (ref 65–99)
Glucose, Bld: 144 mg/dL — ABNORMAL HIGH (ref 65–99)
POTASSIUM: 2.3 mmol/L — AB (ref 3.5–5.1)
Potassium: <2 mmol/L — CL (ref 3.5–5.1)
SODIUM: 122 mmol/L — AB (ref 135–145)
SODIUM: 118 mmol/L — AB (ref 135–145)
TOTAL PROTEIN: 5.8 g/dL — AB (ref 6.5–8.1)
Total Protein: 7 g/dL (ref 6.5–8.1)

## 2015-10-19 LAB — BASIC METABOLIC PANEL
Anion gap: 9 (ref 5–15)
Anion gap: 7 (ref 5–15)
Anion gap: 8 (ref 5–15)
BUN: 38 mg/dL — ABNORMAL HIGH (ref 6–20)
BUN: 31 mg/dL — AB (ref 6–20)
BUN: 27 mg/dL — AB (ref 6–20)
CALCIUM: 7.4 mg/dL — AB (ref 8.9–10.3)
CALCIUM: 7.5 mg/dL — AB (ref 8.9–10.3)
CHLORIDE: 67 mmol/L — AB (ref 101–111)
CO2: 47 mmol/L — ABNORMAL HIGH (ref 22–32)
CO2: 41 mmol/L — AB (ref 22–32)
CO2: 38 mmol/L — AB (ref 22–32)
CREATININE: 0.66 mg/dL (ref 0.44–1.00)
CREATININE: 0.65 mg/dL (ref 0.44–1.00)
Calcium: 7.4 mg/dL — ABNORMAL LOW (ref 8.9–10.3)
Chloride: 75 mmol/L — ABNORMAL LOW (ref 101–111)
Chloride: 80 mmol/L — ABNORMAL LOW (ref 101–111)
Creatinine, Ser: 0.66 mg/dL (ref 0.44–1.00)
GFR calc non Af Amer: >60 mL/min (ref >60)
GFR calc non Af Amer: >60 mL/min (ref >60)
GFR calc non Af Amer: >60 mL/min (ref >60)
Glucose, Bld: 177 mg/dL — ABNORMAL HIGH (ref 65–99)
Glucose, Bld: 203 mg/dL — ABNORMAL HIGH (ref 65–99)
Glucose, Bld: 156 mg/dL — ABNORMAL HIGH (ref 65–99)
POTASSIUM: 2.9 mmol/L — AB (ref 3.5–5.1)
Potassium: 3.2 mmol/L — ABNORMAL LOW (ref 3.5–5.1)
Potassium: 3.3 mmol/L — ABNORMAL LOW (ref 3.5–5.1)
SODIUM: 123 mmol/L — AB (ref 135–145)
SODIUM: 123 mmol/L — AB (ref 135–145)
SODIUM: 126 mmol/L — AB (ref 135–145)

## 2015-10-19 LAB — BLOOD GAS, VENOUS
Acid-Base Excess: 28.7 mmol/L — ABNORMAL HIGH (ref 0.0–2.0)
BICARBONATE: 51.5 meq/L — AB (ref 20.0–24.0)
O2 Content: 2 L/min
O2 SAT: 77.7 %
PCO2 VEN: 65.1 mmHg — AB (ref 45.0–50.0)
PH VEN: 7.535 — AB (ref 7.250–7.300)
PO2 VEN: 42.1 mmHg (ref 30.0–45.0)
TCO2: 13.9 mmol/L (ref 0–100)

## 2015-10-19 LAB — URINALYSIS, ROUTINE W REFLEX MICROSCOPIC
Bilirubin Urine: NEGATIVE
Glucose, UA: NEGATIVE mg/dL
HGB URINE DIPSTICK: NEGATIVE
KETONES UR: NEGATIVE mg/dL
Leukocytes, UA: NEGATIVE
NITRITE: NEGATIVE
PH: 6.5 (ref 5.0–8.0)
Protein, ur: NEGATIVE mg/dL

## 2015-10-19 LAB — OSMOLALITY: Osmolality: 268 mOsm/kg — ABNORMAL LOW (ref 275–295)

## 2015-10-19 LAB — TSH: TSH: 3.951 u[IU]/mL (ref 0.350–4.500)

## 2015-10-19 LAB — MRSA PCR SCREENING: MRSA by PCR: POSITIVE — AB

## 2015-10-19 LAB — MAGNESIUM: Magnesium: 3.2 mg/dL — ABNORMAL HIGH (ref 1.7–2.4)

## 2015-10-19 LAB — SODIUM, URINE, RANDOM: SODIUM UR: 97 mmol/L

## 2015-10-19 MED ORDER — MORPHINE SULFATE (PF) 2 MG/ML IV SOLN
2.0000 mg | INTRAVENOUS | Status: DC | PRN
  Administered 2015-10-19: 2 mg via INTRAVENOUS
  Filled 2015-10-19: qty 1

## 2015-10-19 MED ORDER — SODIUM CHLORIDE 0.9 % IV BOLUS (SEPSIS)
1000.0000 mL | Freq: Once | INTRAVENOUS | Status: AC
  Administered 2015-10-19: 1000 mL via INTRAVENOUS

## 2015-10-19 MED ORDER — PANTOPRAZOLE SODIUM 40 MG PO TBEC
40.0000 mg | DELAYED_RELEASE_TABLET | Freq: Every day | ORAL | Status: DC
  Administered 2015-10-19 – 2015-10-21 (×3): 40 mg via ORAL
  Filled 2015-10-19 (×3): qty 1

## 2015-10-19 MED ORDER — KCL IN DEXTROSE-NACL 40-5-0.9 MEQ/L-%-% IV SOLN
INTRAVENOUS | Status: DC
  Administered 2015-10-19 – 2015-10-20 (×4): via INTRAVENOUS
  Administered 2015-10-21: 125 mL/h via INTRAVENOUS
  Administered 2015-10-21: 09:00:00 via INTRAVENOUS
  Filled 2015-10-19 (×11): qty 1000

## 2015-10-19 MED ORDER — SODIUM CHLORIDE 0.9 % IV SOLN
Freq: Once | INTRAVENOUS | Status: AC
  Administered 2015-10-19: 1000 mL via INTRAVENOUS

## 2015-10-19 MED ORDER — ACETAZOLAMIDE 250 MG PO TABS
250.0000 mg | ORAL_TABLET | Freq: Three times a day (TID) | ORAL | Status: AC
  Administered 2015-10-19 (×3): 250 mg via ORAL
  Filled 2015-10-19 (×3): qty 1

## 2015-10-19 MED ORDER — CHLORHEXIDINE GLUCONATE CLOTH 2 % EX PADS
6.0000 | MEDICATED_PAD | Freq: Every day | CUTANEOUS | Status: DC
  Administered 2015-10-20 – 2015-10-21 (×2): 6 via TOPICAL

## 2015-10-19 MED ORDER — GABAPENTIN 300 MG PO CAPS
300.0000 mg | ORAL_CAPSULE | Freq: Two times a day (BID) | ORAL | Status: DC
  Administered 2015-10-19 – 2015-10-21 (×5): 300 mg via ORAL
  Filled 2015-10-19 (×5): qty 1

## 2015-10-19 MED ORDER — SODIUM CHLORIDE 0.9% FLUSH
3.0000 mL | Freq: Two times a day (BID) | INTRAVENOUS | Status: DC
  Administered 2015-10-19: 3 mL via INTRAVENOUS

## 2015-10-19 MED ORDER — KCL IN DEXTROSE-NACL 20-5-0.9 MEQ/L-%-% IV SOLN: INTRAVENOUS | Status: DC

## 2015-10-19 MED ORDER — POTASSIUM CHLORIDE CRYS ER 20 MEQ PO TBCR
40.0000 meq | EXTENDED_RELEASE_TABLET | Freq: Two times a day (BID) | ORAL | Status: AC
  Administered 2015-10-19 (×2): 40 meq via ORAL
  Filled 2015-10-19 (×2): qty 2

## 2015-10-19 MED ORDER — HEPARIN SODIUM (PORCINE) 5000 UNIT/ML IJ SOLN
5000.0000 [IU] | Freq: Three times a day (TID) | INTRAMUSCULAR | Status: DC
  Administered 2015-10-19 – 2015-10-21 (×8): 5000 [IU] via SUBCUTANEOUS
  Filled 2015-10-19 (×8): qty 1

## 2015-10-19 MED ORDER — MUPIROCIN 2 % EX OINT
1.0000 "application " | TOPICAL_OINTMENT | Freq: Two times a day (BID) | CUTANEOUS | Status: DC
  Administered 2015-10-19 – 2015-10-21 (×4): 1 via NASAL
  Filled 2015-10-19 (×2): qty 22

## 2015-10-19 MED ORDER — POTASSIUM CHLORIDE 10 MEQ/100ML IV SOLN
10.0000 meq | INTRAVENOUS | Status: AC
  Administered 2015-10-19 (×4): 10 meq via INTRAVENOUS
  Filled 2015-10-19 (×4): qty 100

## 2015-10-19 MED ORDER — POTASSIUM CHLORIDE 10 MEQ/100ML IV SOLN
10.0000 meq | INTRAVENOUS | Status: AC
Start: 2015-10-19 — End: 2015-10-19
  Administered 2015-10-19 (×4): 10 meq via INTRAVENOUS
  Filled 2015-10-19 (×4): qty 100

## 2015-10-19 MED ORDER — LEVOTHYROXINE SODIUM 50 MCG PO TABS
50.0000 ug | ORAL_TABLET | Freq: Every day | ORAL | Status: DC
  Administered 2015-10-19 – 2015-10-21 (×3): 50 ug via ORAL
  Filled 2015-10-19 (×2): qty 2
  Filled 2015-10-19: qty 1

## 2015-10-19 MED ORDER — KCL IN DEXTROSE-NACL 20-5-0.9 MEQ/L-%-% IV SOLN
Freq: Once | INTRAVENOUS | Status: AC
  Administered 2015-10-19: 07:00:00 via INTRAVENOUS

## 2015-10-19 MED ORDER — SIMVASTATIN 20 MG PO TABS
20.0000 mg | ORAL_TABLET | Freq: Every day | ORAL | Status: DC
  Administered 2015-10-19 – 2015-10-20 (×2): 20 mg via ORAL
  Filled 2015-10-19 (×2): qty 1

## 2015-10-19 NOTE — Progress Notes (Signed)
Patient admitted from [facility]. CSW left message for family- await callback.   Eduard Clos, MSW, [place]

## 2015-10-19 NOTE — Progress Notes (Signed)
[facility] PROGRESS NOTE  Jaclyn Simpson [id number] DOB: 03-Jul-1917 DOA: 10/18/2015 PCP: Purvis Kilts, MD  HPI/Brief narrative Please see admit h and p from 2/7 for details. Briefly, 82 y.o. female with PMH of HLD and ovarian cyst presents from assisted living home with reports of altered mental status and increasing weakness onset one week ago and has increasingly worsened. Patient reportedly was taking extra doses of diuretics for LE edema. Pt found to have CO2 of 65, Na of 118, Na 118, K of 1.6, WBC of 19.4. Patient was admitted for further work up.  Assessment/Plan: 1. Toxic-metabolic encephalopathy 1. Suspect secondary to marked dehydration, electrolyte abnormalities below 2. Cont IVF hydration 3. Cont monitor 2. Hypokalemia 1. Suspect secondary to recent diuresis 2. Replaced. 3. Continue to correct as needed 3. Hyponatremia 1. Improving with hydration 2. Suspect secondary to dehydration/diuresis 3. Cont to follow sodium levels 4. Dehydration 1. Continue IVF hydration as tolerated 5. Hx of LLE celluliits 1. Presenting leukocytosis of 19k - suspect secondary to dehydration 2. LLE evaluated and does not appear infected with scab 6. HLD 1. Stable 7. DVT prophylaxis 1. Heparin subQ  Code Status: DNR Family Communication: Pt in room Disposition Plan: Unclear at this time   Consultants:    Procedures:    Antibiotics: Anti-infectives    None      HPI/Subjective: Confused this AM, unable to assess  Objective: Filed Vitals:   10/19/15 1000 10/19/15 1100 10/19/15 1200 10/19/15 1353  BP: 99/54 107/55 114/58   Pulse: 57 57 58   Temp:    96.8 F (36 C)  TempSrc:    Oral  Resp: 16 11 14    Height:      Weight:      SpO2: 98% 96% 98%     Intake/Output Summary (Last 24 hours) at 10/19/15 1427 Last data filed at 10/19/15 1353  Gross per 24 hour  Intake 2856.25 ml  Output   3825 ml  Net -968.75 ml   Filed Weights   10/19/15 0432 10/19/15  0450  Weight: 53.4 kg (117 lb 11.6 oz) 53.4 kg (117 lb 11.6 oz)    Exam:   General:  Awake, in nad, confused  Cardiovascular: regular, s1, s2  Respiratory: normal resp effort, no wheezing  Abdomen: soft,nondistend  Musculoskeletal: perfused, no clubbing   Data Reviewed: Basic Metabolic Panel:  Recent Labs Lab 10/18/15 2336 10/19/15 0515 10/19/15 1036  NA 118* 122* 123*  K <2.0* 2.3* 2.9*  CL <65* <65* 67*  CO2 52* 49* 47*  GLUCOSE 144* 128* 177*  BUN 57* 44* 38*  CREATININE 0.99 0.79 0.66  CALCIUM 8.6* 7.4* 7.4*  MG 3.2*  --   --    Liver Function Tests:  Recent Labs Lab 10/18/15 2336 10/19/15 0515  AST 31 26  ALT 18 15  ALKPHOS 85 71  BILITOT 1.9* 1.3*  PROT 7.0 5.8*  ALBUMIN 4.0 3.3*   No results for input(s): LIPASE, AMYLASE in the last 168 hours. No results for input(s): AMMONIA in the last 168 hours. CBC:  Recent Labs Lab 10/18/15 2336  WBC 19.4*  NEUTROABS 17.5*  HGB 14.7  HCT 41.9  MCV 87.8  PLT 294   Cardiac Enzymes: No results for input(s): CKTOTAL, CKMB, CKMBINDEX, TROPONINI in the last 168 hours. BNP (last 3 results) No results for input(s): BNP in the last 8760 hours.  ProBNP (last 3 results) No results for input(s): PROBNP in the last 8760 hours.  CBG: No results for  input(s): GLUCAP in the last 168 hours.  No results found for this or any previous visit (from the past 240 hour(s)).   Studies: Ct Head Wo Contrast  10/19/2015  CLINICAL DATA:  Altered mental status and multiple falls for a few days. History of syncope, cavernous angioma and stroke. EXAM: CT HEAD WITHOUT CONTRAST TECHNIQUE: Contiguous axial images were obtained from the base of the skull through the vertex without intravenous contrast. COMPARISON:  CT head September 27, 2015 FINDINGS: The ventricles and sulci are normal for age. No intraparenchymal hemorrhage, mass effect nor midline shift. Patchy supratentorial white matter hypodensities are less than expected for  patient's age and though non-specific suggest sequelae of chronic small vessel ischemic disease. No acute large vascular territory infarcts. No abnormal extra-axial fluid collections. Basal cisterns are patent. Moderate calcific atherosclerosis of the carotid siphons. 1 cm heterogeneously calcified mass RIGHT pons. No mass effect. No skull fracture. 1 cm lucency LEFT parietal cortex favoring vascular [place] Osteopenia. Again noted is asymmetric RIGHT muscles of mastication atrophy. The included ocular globes and orbital contents are non-suspicious. Status post bilateral ocular lens implants. Minimal RIGHT mastoid effusion. LEFT mastoid air cells are well aerated. Imaged paranasal sinuses are well-aerated. IMPRESSION: No acute intracranial process. 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 Electronically Signed   By: Elon Alas M.D.   On: 10/19/2015 00:48   Dg Abd 2 Views  10/19/2015  CLINICAL DATA:  82 year old female with altered mental status and loss of appetite. EXAM: ABDOMEN - 2 VIEW COMPARISON:  Radiograph dated 07/16/2015 FINDINGS: The visualized portion of the lungs demonstrate emphysematous changes with bibasilar atelectasis/ scarring. No evidence of bowel obstruction. No free air. There is osteopenia with degenerative changes of the spine. No definite acute fracture. IMPRESSION: No bowel obstruction. Electronically Signed   By: Anner Crete M.D.   On: 10/19/2015 00:13    Scheduled Meds: . acetaZOLAMIDE  250 mg Oral TID  . gabapentin  300 mg Oral BID  . heparin  5,000 Units Subcutaneous 3 times per day  . levothyroxine  50 mcg Oral QAC breakfast  . pantoprazole  40 mg Oral Daily  . potassium chloride  40 mEq Oral BID  . simvastatin  20 mg Oral QHS  . sodium chloride flush  3 mL Intravenous Q12H   Continuous Infusions: . dextrose 5 % and 0.9 % NaCl with KCl 40 mEq/L 125 mL/hr at 10/19/15 1117    Principal Problem:    Dehydration Active Problems:   Aortic valve stenosis   Hyperlipidemia   Hypothyroid   Hypokalemia   Hypochloremic alkalosis   DNR (do not resuscitate)   Delphia Kaylor, [facility] Pager [phone]. If 7PM-7AM, please contact night-coverage at [email or web address], password [facility] 10/19/2015, 2:27 PM  LOS: 0 days

## 2015-10-19 NOTE — ED Notes (Signed)
CRITICAL VALUE ALERT  Critical value received:  NA 118, Potassium 1.8, Chloride 47, CO2 52  Date of notification:  10/19/2015  Time of notification:  0019  Critical value read back:Yes.    Nurse who received alert:  Fabio Neighbors RN  MD notified (1st page):  Dr. Tomi Bamberger

## 2015-10-19 NOTE — ED Notes (Signed)
Attempted to call [place] [facility] to inform them the pt was being admitted to the hospital.  No answer.

## 2015-10-19 NOTE — Progress Notes (Signed)
CRITICAL VALUE ALERT  Critical value received:  K+ 2.3, chloride <65  Date of notification:  10/19/2015  Time of notification:  0620  Critical value read back:Yes.    Nurse who received alert:  Wonda Cheng, RN, MSN  MD notified (1st page):  Dr Susanne Borders  Time of first page:  0631  MD notified (2nd page):  Time of second page:  Responding MD:  Dr Susanne Borders  Time MD responded: [phone]  Orders received

## 2015-10-19 NOTE — ED Notes (Signed)
Attempted to call [facility] to inform them pt was being admitted to the hospital. Again no answer.

## 2015-10-19 NOTE — H&P (Signed)
[facility] History and Physical  Jaclyn Simpson [id number] DOB: [birth year]    PCP:   Purvis Kilts, MD   Chief Complaint: Altered Mental Status   HPI: Jaclyn Simpson is an 82 y.o. female  with PMH of HLD and ovarian cyst presents from assisted living home with reports of altered mental status and increasing weakness onset one week ago and has increasingly worsened. Niece reports that patient may have been receiving extra doses of diuretics due to swelling in right leg. Nephew reports that she has also been constipated. At baseline patient is noted to be alert with clear speech. They also report that she has had decreased PO intake. Lab workup revealed CO2 65.1, Sodium 118; Potassium 1.6; WBC 19.4; and UA unremarkable. CT Head and CXR were both unremarkable. Hospitalist has been asked to admit for metabolic encephalopathy, severe hypokalemia, hyponatremia, and dehydration resulting in severe contracting alkalosis.  Venous pH was found to be 7.52.  She has been a DNR with a golden rod.  Rewiew of Systems:  Unable to obtain due to patient's mental status.    Past Medical History  Diagnosis Date  . Cerebrovascular accident [facility]) 1975    Right brainstem  . Colles' fracture     right wrist  . Humerus fracture     proximal  . Syncope     Echo in 12/2010-mild to moderate LVH; normal EF; mild AS and AI  . Aortic valve stenosis   . Spinal stenosis   . Cavernous angioma     Pons/cerebellum  . Diverticulosis   . Anemia, iron deficiency     normal CBC in 2012  . Cyst of skin     right inner thigh    Past Surgical History  Procedure Laterality Date  . Total abdominal hysterectomy  1940s  . Thyroidectomy  1971    Goiter  . Lumbar spine surgery    . Colonoscopy  2001  . Cataract extraction      Bilateral  . Appendectomy  1940s  . Breast surgery      Medications:  HOME MEDS: Prior to Admission medications   Medication Sig Start Date End Date Taking?  Authorizing Provider  alendronate (FOSAMAX) 70 MG tablet Take 70 mg by mouth every 7 (seven) days. Take with a full glass of water on an empty stomach. Take on Mondays   Yes Historical Provider, MD  Calcium Carbonate-Vitamin D (CALCIUM 600+D) 600-400 MG-UNIT tablet Take 1 tablet by mouth daily.   Yes Historical Provider, MD  clotrimazole-betamethasone (LOTRISONE) cream Apply 1 application topically 2 (two) times daily.   Yes Historical Provider, MD  cyanocobalamin (,VITAMIN B-12,) 1000 MCG/ML injection Inject 1,000 mcg into the muscle every 30 (thirty) days.    Yes Historical Provider, MD  furosemide (LASIX) 40 MG tablet Take 80 mg by mouth 2 (two) times daily.    Yes Historical Provider, MD  gabapentin (NEURONTIN) 300 MG capsule Take 300 mg by mouth 2 (two) times daily.     Yes Historical Provider, MD  HYDROcodone-acetaminophen (NORCO/VICODIN) 5-325 MG per tablet Take 1 tablet by mouth 3 (three) times daily.  05/26/14  Yes Historical Provider, MD  levothyroxine (SYNTHROID, LEVOTHROID) 50 MCG tablet Take 50 mcg by mouth daily before breakfast.   Yes Historical Provider, MD  lubiprostone (AMITIZA) 8 MCG capsule Take 8 mcg by mouth daily.    Yes Historical Provider, MD  magnesium hydroxide (MILK OF MAGNESIA) 400 MG/5ML suspension Take 30 mLs by mouth 2 (two) times  daily as needed for mild constipation.   Yes Historical Provider, MD  metolazone (ZAROXOLYN) 2.5 MG tablet Take 2.5 mg by mouth daily. To  Take 30 minutes prior to Lasix (Furosemide)   Yes Historical Provider, MD  Multiple Vitamins-Minerals (ICAPS AREDS 2 PO) Take 1 tablet by mouth daily.   Yes Historical Provider, MD  mupirocin ointment (BACTROBAN) 2 % Apply 1 application topically 3 (three) times daily as needed.   Yes Historical Provider, MD  omeprazole (PRILOSEC) 20 MG capsule Take 20 mg by mouth daily.     Yes Historical Provider, MD  polycarbophil (FIBER-LAX) 625 MG tablet Take 1,250 mg by mouth daily.   Yes Historical Provider, MD   polyethylene glycol (MIRALAX / GLYCOLAX) packet Take 17 g by mouth daily.   Yes Historical Provider, MD  promethazine-codeine (PHENERGAN WITH CODEINE) 6.25-10 MG/5ML syrup Take by mouth every 6 (six) hours as needed for cough.   Yes Historical Provider, MD  senna-docusate (SENOKOT-S) 8.6-50 MG tablet Take 1 tablet by mouth daily.   Yes Historical Provider, MD  simvastatin (ZOCOR) 20 MG tablet Take 1 tablet by mouth at bedtime. 09/23/15  Yes Historical Provider, MD  cephALEXin (KEFLEX) 250 MG capsule Take 250 mg by mouth 4 (four) times daily. Reported on 10/18/2015    Historical Provider, MD     Allergies:  No Known Allergies  Social History:   reports that she has never smoked. She has never used smokeless tobacco. She reports that she does not drink alcohol or use illicit drugs.  Family History: Family History  Problem Relation Age of Onset  . Heart disease Father   . Stroke Mother   . Pneumonia Brother     x2 brothers  . Heart attack Brother   . Cancer Sister     brain tumor     Physical Exam: Filed Vitals:   10/18/15 2229 10/18/15 2300 10/19/15 0103  BP: 128/96 116/65 113/51  Pulse: 52 53 50  Temp: 97.9 F (36.6 C)    TempSrc: Oral    Resp: 16 16 23   SpO2: 96% 96% 95%   Blood pressure 113/51, pulse 50, temperature 97.9 F (36.6 C), temperature source Oral, resp. rate 23, SpO2 95 %.  GEN:  Pleasant, patient lying in the bed in no acute distress; cooperative with exam. PSYCH: Does not appear anxious or depressed; affect is appropriate. HEENT: Mucous membranes pink and anicteric; PERRLA; EOM intact; no cervical lymphadenopathy nor thyromegaly or carotid bruit; no JVD; There were no stridor. Neck is very supple. Breasts:: Not examined CHEST WALL: No tenderness CHEST: Normal respiration, clear to auscultation bilaterally.  HEART: Regular rate and rhythm.  There are no murmur, rub, or gallops.   BACK: No kyphosis or scoliosis; no CVA tenderness ABDOMEN: soft and  non-tender; no masses, no organomegaly, normal abdominal bowel sounds; no pannus; no intertriginous candida. There is no rebound and no distention. Rectal Exam: Not done EXTREMITIES: No bone or joint deformity; age-appropriate arthropathy of the hands and knees; no edema; no ulcerations.  There is no calf tenderness. Genitalia: not examined PULSES: 2+ and symmetric SKIN: Normal hydration no rash or ulceration CNS: Cranial nerves 2-12 grossly intact no focal lateralizing neurologic deficit.  Speech is fluent; uvula elevated with phonation, facial symmetry and tongue midline. DTR are normal bilaterally, cerebella exam is intact, barbinski is negative and strengths are equaled bilaterally.  No sensory loss.   Labs on Admission:  Basic Metabolic Panel:  Recent Labs Lab 10/18/15 2336  NA 118*  K <2.0*  CL <65*  CO2 52*  GLUCOSE 144*  BUN 57*  CREATININE 0.99  CALCIUM 8.6*  MG 3.2*   Liver Function Tests:  Recent Labs Lab 10/18/15 2336  AST 31  ALT 18  ALKPHOS 85  BILITOT 1.9*  PROT 7.0  ALBUMIN 4.0  CBC:  Recent Labs Lab 10/18/15 2336  WBC 19.4*  NEUTROABS 17.5*  HGB 14.7  HCT 41.9  MCV 87.8  PLT 294    Radiological Exams on Admission: Ct Head Wo Contrast  10/19/2015  CLINICAL DATA:  Altered mental status and multiple falls for a few days. History of syncope, cavernous angioma and stroke. EXAM: CT HEAD WITHOUT CONTRAST TECHNIQUE: Contiguous axial images were obtained from the base of the skull through the vertex without intravenous contrast. COMPARISON:  CT head September 27, 2015 FINDINGS: The ventricles and sulci are normal for age. No intraparenchymal hemorrhage, mass effect nor midline shift. Patchy supratentorial white matter hypodensities are less than expected for patient's age and though non-specific suggest sequelae of chronic small vessel ischemic disease. No acute large vascular territory infarcts. No abnormal extra-axial fluid collections. Basal cisterns are  patent. Moderate calcific atherosclerosis of the carotid siphons. 1 cm heterogeneously calcified mass RIGHT pons. No mass effect. No skull fracture. 1 cm lucency LEFT parietal cortex favoring vascular [place] Osteopenia. Again noted is asymmetric RIGHT muscles of mastication atrophy. The included ocular globes and orbital contents are non-suspicious. Status post bilateral ocular lens implants. Minimal RIGHT mastoid effusion. LEFT mastoid air cells are well aerated. Imaged paranasal sinuses are well-aerated. IMPRESSION: No acute intracranial process. 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 Electronically Signed   By: Elon Alas M.D.   On: 10/19/2015 00:48   Dg Abd 2 Views  10/19/2015  CLINICAL DATA:  82 year old female with altered mental status and loss of appetite. EXAM: ABDOMEN - 2 VIEW COMPARISON:  Radiograph dated 07/16/2015 FINDINGS: The visualized portion of the lungs demonstrate emphysematous changes with bibasilar atelectasis/ scarring. No evidence of bowel obstruction. No free air. There is osteopenia with degenerative changes of the spine. No definite acute fracture. IMPRESSION: No bowel obstruction. Electronically Signed   By: Anner Crete M.D.   On: 10/19/2015 00:13    EKG: Independently reviewed.    Assessment/Plan 1. Metabolic acute encephalopathy, Patient noted to be severely dehydrated, hypokalemic, hyponatremic and with elevated CO2.  She will need IVF, correction of her contracting alkalosis. Will need to give her Diamox for 24 hours.  2. Hypokalemia, will replace carefully. 3. Hyponatremia, will replace with NS, avoiding over correction.   4. Hypochloremic,  Will provide IVF.  5. Dehydrated, will provide IVF. 6. Ulcer and cellulitis on lower left leg, noted on admission.  7. Failure to thrive, patient has been reportingly declining and PO Intake and ambulation.  8. HLD, continue statin. 9. GERD, continue PPI. 10. Right  ovarian cyst, follow up as scheduled.  PLAN:  Admit to ICU, telemetry, maintaining DNR code status.   Provide Diamox, replace sodium and potassium, with close monitoring..   IVF  Check thyroid.   Other plans as per orders.  Code Status: DNR DVT prophylaxis:SQ heparin.  Family Communication: Niece and nephew bedside.  Disposition: Admit to ICU   Orvan Falconer, MD. FACP [facility] Pager [phone] 7pm to 7am.  10/19/2015, 1:16 AM    By signing my name below, I, Jaclyn Simpson, attest that this documentation has been prepared under the direction and in the presence  of Orvan Falconer, MD. Electronically signed: Rennis Simpson, Scribe. 10/19/2015 1:45am.

## 2015-10-20 DIAGNOSIS — E860 Dehydration: Secondary | ICD-10-CM

## 2015-10-20 DIAGNOSIS — R338 Other retention of urine: Secondary | ICD-10-CM

## 2015-10-20 DIAGNOSIS — Z66 Do not resuscitate: Secondary | ICD-10-CM

## 2015-10-20 LAB — COMPREHENSIVE METABOLIC PANEL
ALBUMIN: 3.1 g/dL — AB (ref 3.5–5.0)
ALK PHOS: 70 U/L (ref 38–126)
ALT: 14 U/L (ref 14–54)
ANION GAP: 6 (ref 5–15)
AST: 19 U/L (ref 15–41)
BUN: 24 mg/dL — ABNORMAL HIGH (ref 6–20)
CALCIUM: 7.7 mg/dL — AB (ref 8.9–10.3)
CO2: 36 mmol/L — AB (ref 22–32)
Chloride: 88 mmol/L — ABNORMAL LOW (ref 101–111)
Creatinine, Ser: 0.65 mg/dL (ref 0.44–1.00)
GFR calc Af Amer: >60 mL/min (ref >60)
GFR calc non Af Amer: >60 mL/min (ref >60)
GLUCOSE: 164 mg/dL — AB (ref 65–99)
POTASSIUM: 3.9 mmol/L (ref 3.5–5.1)
SODIUM: 130 mmol/L — AB (ref 135–145)
Total Bilirubin: 0.6 mg/dL (ref 0.3–1.2)
Total Protein: 5.6 g/dL — ABNORMAL LOW (ref 6.5–8.1)

## 2015-10-20 LAB — OSMOLALITY, URINE: Osmolality, Ur: 307 mOsm/kg (ref 300–900)

## 2015-10-20 LAB — CBC
HEMATOCRIT: 40 % (ref 36.0–46.0)
HEMOGLOBIN: 12.9 g/dL (ref 12.0–15.0)
MCH: 30.1 pg (ref 26.0–34.0)
MCHC: 32.3 g/dL (ref 30.0–36.0)
MCV: 93.5 fL (ref 78.0–100.0)
Platelets: 276 10*3/uL (ref 150–400)
RBC: 4.28 MIL/uL (ref 3.87–5.11)
RDW: 14 % (ref 11.5–15.5)
WBC: 6.9 10*3/uL (ref 4.0–10.5)

## 2015-10-20 NOTE — Progress Notes (Signed)
[facility] PROGRESS NOTE  Jaclyn Simpson [id number] DOB: 25-Dec-1916 DOA: 10/18/2015 PCP: Purvis Kilts, MD  Assessment/Plan: Acute metabolic encephalopathy -Suspect related to marked dehydration and hyponatremia, significantly improved today.  Hypokalemia/hyponatremia -Improving with IV fluids and potassium repletion. -Continue to monitor.  History of left lower extremity cellulitis -Does not appear to have an active infection, no antibiotics required.  Hyperlipidemia -Stable  Code Status: DO NOT RESUSCITATE Family Communication: Multiple nieces at bedside updated on plan of care and all questions answered  Disposition Plan: Transfer to floor, hopefully back to ALF in 24 hours   Consultants:  None   Antibiotics:  None   Subjective: Sleepy but easily arousable and oriented  Objective: Filed Vitals:   10/20/15 0900 10/20/15 1000 10/20/15 1034 10/20/15 1100  BP: 96/43 104/52  93/47  Pulse: 53 56  59  Temp:   96.7 F (35.9 C)   TempSrc:   Axillary   Resp: 8 15  12   Height:      Weight:      SpO2: 99% 99%  98%    Intake/Output Summary (Last 24 hours) at 10/20/15 1126 Last data filed at 10/20/15 0700  Gross per 24 hour  Intake 2342.58 ml  Output   3550 ml  Net -1207.42 ml   Filed Weights   10/19/15 0432 10/19/15 0450 10/20/15 0500  Weight: 53.4 kg (117 lb 11.6 oz) 53.4 kg (117 lb 11.6 oz) 53.8 kg (118 lb 9.7 oz)    Exam:   General:  Sleepy, oriented when awake  Cardiovascular: Regular rate and rhythm  Respiratory: Clear to auscultation bilaterally  Abdomen: Soft, nontender, nondistended, positive bowel sounds  Extremities: Trace bilateral edema, positive pulses, well-healed scar over the medial aspect of her ankle   Neurologic:  Grossly intact and nonfocal  Data Reviewed: Basic Metabolic Panel:  Recent Labs Lab 10/18/15 2336 10/19/15 0515 10/19/15 1036 10/19/15 1731 10/19/15 2231 10/20/15 0518  NA 118* 122* 123*  123* 126* 130*  K <2.0* 2.3* 2.9* 3.2* 3.3* 3.9  CL <65* <65* 67* 75* 80* 88*  CO2 52* 49* 47* 41* 38* 36*  GLUCOSE 144* 128* 177* 203* 156* 164*  BUN 57* 44* 38* 31* 27* 24*  CREATININE 0.99 0.79 0.66 0.66 0.65 0.65  CALCIUM 8.6* 7.4* 7.4* 7.4* 7.5* 7.7*  MG 3.2*  --   --   --   --   --    Liver Function Tests:  Recent Labs Lab 10/18/15 2336 10/19/15 0515 10/20/15 0518  AST 31 26 19   ALT 18 15 14   ALKPHOS 85 71 70  BILITOT 1.9* 1.3* 0.6  PROT 7.0 5.8* 5.6*  ALBUMIN 4.0 3.3* 3.1*   No results for input(s): LIPASE, AMYLASE in the last 168 hours. No results for input(s): AMMONIA in the last 168 hours. CBC:  Recent Labs Lab 10/18/15 2336 10/20/15 0518  WBC 19.4* 6.9  NEUTROABS 17.5*  --   HGB 14.7 12.9  HCT 41.9 40.0  MCV 87.8 93.5  PLT 294 276   Cardiac Enzymes: No results for input(s): CKTOTAL, CKMB, CKMBINDEX, TROPONINI in the last 168 hours. BNP (last 3 results) No results for input(s): BNP in the last 8760 hours.  ProBNP (last 3 results) No results for input(s): PROBNP in the last 8760 hours.  CBG: No results for input(s): GLUCAP in the last 168 hours.  Recent Results (from the past 240 hour(s))  MRSA PCR Screening     Status: Abnormal   Collection Time: 10/19/15  4:50 AM  Result Value Ref Range Status   MRSA by PCR POSITIVE (A) NEGATIVE Final    Comment: RESULT CALLED TO, READ BACK BY AND VERIFIED WITH: HAMMOCK,S AT 2030 ON 10/19/2015 BY ISLEY,B        The GeneXpert MRSA Assay (FDA approved for NASAL specimens only), is one component of a comprehensive MRSA colonization surveillance program. It is not intended to diagnose MRSA infection nor to guide or monitor treatment for MRSA infections.      Studies: Ct Head Wo Contrast  10/19/2015  CLINICAL DATA:  Altered mental status and multiple falls for a few days. History of syncope, cavernous angioma and stroke. EXAM: CT HEAD WITHOUT CONTRAST TECHNIQUE: Contiguous axial images were obtained from the  base of the skull through the vertex without intravenous contrast. COMPARISON:  CT head September 27, 2015 FINDINGS: The ventricles and sulci are normal for age. No intraparenchymal hemorrhage, mass effect nor midline shift. Patchy supratentorial white matter hypodensities are less than expected for patient's age and though non-specific suggest sequelae of chronic small vessel ischemic disease. No acute large vascular territory infarcts. No abnormal extra-axial fluid collections. Basal cisterns are patent. Moderate calcific atherosclerosis of the carotid siphons. 1 cm heterogeneously calcified mass RIGHT pons. No mass effect. No skull fracture. 1 cm lucency LEFT parietal cortex favoring vascular [place] Osteopenia. Again noted is asymmetric RIGHT muscles of mastication atrophy. The included ocular globes and orbital contents are non-suspicious. Status post bilateral ocular lens implants. Minimal RIGHT mastoid effusion. LEFT mastoid air cells are well aerated. Imaged paranasal sinuses are well-aerated. IMPRESSION: No acute intracranial process. 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 Electronically Signed   By: Elon Alas M.D.   On: 10/19/2015 00:48   Dg Abd 2 Views  10/19/2015  CLINICAL DATA:  82 year old female with altered mental status and loss of appetite. EXAM: ABDOMEN - 2 VIEW COMPARISON:  Radiograph dated 07/16/2015 FINDINGS: The visualized portion of the lungs demonstrate emphysematous changes with bibasilar atelectasis/ scarring. No evidence of bowel obstruction. No free air. There is osteopenia with degenerative changes of the spine. No definite acute fracture. IMPRESSION: No bowel obstruction. Electronically Signed   By: Anner Crete M.D.   On: 10/19/2015 00:13    Scheduled Meds: . Chlorhexidine Gluconate Cloth  6 each Topical Q0600  . gabapentin  300 mg Oral BID  . heparin  5,000 Units Subcutaneous 3 times per day  . levothyroxine  50  mcg Oral QAC breakfast  . mupirocin ointment  1 application Nasal BID  . pantoprazole  40 mg Oral Daily  . simvastatin  20 mg Oral QHS  . sodium chloride flush  3 mL Intravenous Q12H   Continuous Infusions: . dextrose 5 % and 0.9 % NaCl with KCl 40 mEq/L 125 mL/hr at 10/20/15 0600    Principal Problem:   Dehydration Active Problems:   Aortic valve stenosis   Hyperlipidemia   Hypothyroid   Hypokalemia   Hypochloremic alkalosis   DNR (do not resuscitate)   Acute urinary retention   Altered mental status   Hypermagnesemia   Hyponatremia    Time spent: 25 minutes. Greater than 50% of this time was spent in direct contact with the patient coordinating care.    Lelon Frohlich  [facility] Pager [phone]  If 7PM-7AM, please contact night-coverage at [email or web address], password [facility] 10/20/2015, 11:26 AM  LOS: 1 day

## 2015-10-20 NOTE — Care Management Note (Signed)
Case Management Note  Patient Details  Name: Jaclyn Simpson MRN: [id number] Date of Birth: 01-Mar-1917  Subjective/Objective:                  Pt admitted with dehydration. Pt is from [place]. Pt has no HH services prior to admission. Pt has required more assistance with ADL's in the recent past. Pt uses WC as needed. Pt plans to return to ALF, may need HH PT at ALF. CSW is aware of DC plan and will arrange for return to facility.   Action/Plan: Will cont to follow, will arrange for [facility] PT at ALF if appropriate.   Expected Discharge Date:   10/24/2015               Expected Discharge Plan:  Assisted Living / Rest Home  In-House Referral:  Clinical Social Work  Discharge planning Services  CM Consult  Post Acute Care Choice:  NA Choice offered to:  NA  DME Arranged:    DME Agency:     HH Arranged:    HH Agency:     Status of Service:  In process, will continue to follow  Medicare Important Message Given:    Date Medicare IM Given:    Medicare IM give by:    Date Additional Medicare IM Given:    Additional Medicare Important Message give by:     If discussed at [place] of Stay Meetings, dates discussed:    Additional Comments:  Sherald Barge, RN 10/20/2015, 2:39 PM

## 2015-10-20 NOTE — Clinical Social Work Note (Signed)
Clinical Social Work Assessment  Patient Details  Name: Jaclyn Simpson MRN: [id number] Date of Birth: [birth year]  Date of referral:  10/20/15               Reason for consult:  Facility Placement                Permission sought to share information with:    Permission granted to share information::     Name::        Agency::     Relationship::     Contact Information:     Housing/Transportation Living arrangements for the past 2 months:  [place] of Information:   Saddie Benders, Benjamine Mola) Patient Interpreter Needed:  None Criminal Activity/Legal Involvement Pertinent to Current Situation/Hospitalization:  No - Comment as needed Significant Relationships:  Other Family Members Lives with:  Facility Resident Do you feel safe going back to the place where you live?  Yes Need for family participation in patient care:  Yes (Comment)  Care giving concerns:  ALF resident.    Social Worker assessment / plan:  CSW met with patient's niece, Benjamine Mola, who was at bedside with two other family members.  Benjamine Mola identified that patient has been a resident at [facility] for 2.5-3 years.  She stated that at baseline patient required no assistance with ambulating, or ADLs.  She stated that over the past two weeks patient has required assistance with bathing, medication, and has had to use a wheelchair because she was falling a lot. Benjamine Mola stated that she visits with patient very frequently. She stated that they would want patient to return to [facility] at discharge if they could still manage her care.  She stated that she spoke with Juliann Pulse the Director at [facility] who indicated that there were additional services the facility could provide if patient needed increased care.    Employment status:  Retired Forensic scientist:  Medicare PT Recommendations:    Information / Referral to community resources:     Patient/Family's Response to care:  Family is agreeable for  patient to return to [facility].   Patient/Family's Understanding of and Emotional Response to Diagnosis, Current Treatment, and Prognosis:  Family understands patient's diagnosis, treatment and prognosis.    Emotional Assessment Appearance:  Appears stated age Attitude/Demeanor/Rapport:  Unable to Assess Affect (typically observed):  Unable to Assess Orientation:  Oriented to Situation, Oriented to  Time, Oriented to Place, Oriented to Self Alcohol / Substance use:  Not Applicable Psych involvement (Current and /or in the community):  No (Comment)  Discharge Needs  Concerns to be addressed:  Discharge Planning Concerns Readmission within the last 30 days:  No Current discharge risk:  None Barriers to Discharge:  No Barriers Identified   Ihor Gully, LCSW 10/20/2015, 1:51 PM

## 2015-10-20 NOTE — Progress Notes (Signed)
Pt a/o x4.vss. IV patent. No complaints of any distress. Report given to C.Knight, RN. Pt to be transferred to room 338 via bed with nursing staff.

## 2015-10-21 LAB — COMPREHENSIVE METABOLIC PANEL
ALBUMIN: 3 g/dL — AB (ref 3.5–5.0)
ALK PHOS: 70 U/L (ref 38–126)
ALT: 14 U/L (ref 14–54)
AST: 22 U/L (ref 15–41)
Anion gap: 5 (ref 5–15)
BUN: 15 mg/dL (ref 6–20)
CHLORIDE: 101 mmol/L (ref 101–111)
CO2: 27 mmol/L (ref 22–32)
CREATININE: 0.54 mg/dL (ref 0.44–1.00)
Calcium: 7.9 mg/dL — ABNORMAL LOW (ref 8.9–10.3)
GFR calc non Af Amer: >60 mL/min (ref >60)
GLUCOSE: 148 mg/dL — AB (ref 65–99)
Potassium: 4.2 mmol/L (ref 3.5–5.1)
SODIUM: 133 mmol/L — AB (ref 135–145)
Total Bilirubin: 0.4 mg/dL (ref 0.3–1.2)
Total Protein: 5.6 g/dL — ABNORMAL LOW (ref 6.5–8.1)

## 2015-10-21 LAB — CBC
HCT: 38.7 % (ref 36.0–46.0)
HEMOGLOBIN: 12.6 g/dL (ref 12.0–15.0)
MCH: 30.5 pg (ref 26.0–34.0)
MCHC: 32.6 g/dL (ref 30.0–36.0)
MCV: 93.7 fL (ref 78.0–100.0)
Platelets: 302 10*3/uL (ref 150–400)
RBC: 4.13 MIL/uL (ref 3.87–5.11)
RDW: 14.3 % (ref 11.5–15.5)
WBC: 8.1 10*3/uL (ref 4.0–10.5)

## 2015-10-21 NOTE — Clinical Social Work Note (Signed)
CSW spoke with Juliann Pulse at [facility].  CSW discussed facility staff coming to assess patient. Juliann Pulse stated that the facility could now do total care that they would be willing to take patient back without assessment. She advised that the facility could do everything except muscular injection.   CSW notified patient's niece, Benjamine Mola, that patient was being discharged and would be discharged back to [facility].  Benjamine Mola requested that EMS transport patient as she did not feel comfortable transporting patient.   CSW sent clinicals to facility via Epic hub.   CSW arranged transportation via [facility].  CSW spoke with Colletta Maryland and Angie at [facility] and confirmed that patient's hospital bed would be delivered this evening.  Ihor Gully, [place] [phone]

## 2015-10-21 NOTE — Plan of Care (Signed)
Problem: Acute Rehab PT Goals(only PT should resolve) Goal: Pt Will Go Supine/Side To Sit Pt will demonstrate ModI bed mobility supine to sitting edge-of-bed to return to PLOF and to decrease caregiver burden.     Goal: Patient Will Transfer Sit To/From Stand Pt will transfer sit to/from-stand with RW at ModI without loss-of-balance to demonstrate good safety awareness for independent mobility in home.     Goal: Pt Will Ambulate Pt will ambulate with RW at Supervision for a distance greater than 23ft to demonstrate the ability to perform safe household distance ambulation at discharge.

## 2015-10-21 NOTE — [place] FL2 (Signed)
[place] MEDICAID FL2 LEVEL OF CARE SCREENING TOOL     IDENTIFICATION  Patient Name: Jaclyn Simpson Birthdate: 06/24/1917 Sex: female Admission Date (Current Location): 10/18/2015  [facility] and [place] Number:  [employer] and Address:  [place] [address], [place]      Provider Number: [id number]  Attending Physician Name and Address:  [place] Name and Phone Number:       Current Level of Care: Hospital Recommended Level of Care: [place] Prior Approval Number:    Date Approved/Denied:   PASRR Number:    Discharge Plan:  ([place])    Current Diagnoses: Patient Active Problem List   Diagnosis Date Noted  . Dehydration 10/19/2015  . Hypokalemia 10/19/2015  . Hypochloremic alkalosis 10/19/2015  . DNR (do not resuscitate) 10/19/2015  . Acute urinary retention   . Altered mental status   . Hypermagnesemia   . Hyponatremia   . Pelvic mass in female 12/22/2013  . 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 Metatarsal fracture 01/01/2013  . Chronic kidney disease 08/01/2011  . Hyperlipidemia 08/01/2011  . Gastroesophageal reflux disease 08/01/2011  . Osteoporosis 08/01/2011  . Hypothyroid 08/01/2011  . Laboratory test 08/01/2011  . Spinal stenosis   . Edema extremities 07/21/2011  . Syncope   . Aortic valve stenosis   . Cerebrovascular accident ([place]) 09/30/2009    Orientation RESPIRATION BLADDER Height & Weight     Place, Situation, Time, Self  Normal Continent Weight: 118 lb 6.2 oz (53.7 kg) Height:  5\' 2"  (157.5 cm)  BEHAVIORAL SYMPTOMS/MOOD NEUROLOGICAL BOWEL NUTRITION STATUS      Continent  (DIET SOFT. Low sodium, heart healthy)  AMBULATORY STATUS COMMUNICATION OF NEEDS Skin   Extensive Assist Verbally Normal                       Personal Care Assistance Level of Assistance  Dressing, Bathing Bathing Assistance: Maximum  assistance   Dressing Assistance: Maximum assistance     Functional Limitations Info             [place]  PT (By licensed PT)     PT Frequency:  (3x/week)              Contractures      Additional Factors Info  Psychotropic Code Status Info:  (DNR)   Psychotropic Info:  (Neurontin)   Isolation Precautions Info:  (2.7.17 mrsa by pcr)     Current Medications (10/21/2015):  This is the current hospital active medication list Current Facility-Administered Medications  Medication Dose Route Frequency Provider Last Rate Last Dose  . Chlorhexidine Gluconate Cloth 2 % PADS 6 each  6 each Topical Q0600 Orvan Falconer, MD   6 each at 10/21/15 [phone]  . dextrose 5 % and 0.9 % NaCl with KCl 40 mEq/L infusion   Intravenous Continuous Orvan Falconer, MD 125 mL/hr at 10/21/15 [phone]    . gabapentin (NEURONTIN) capsule 300 mg  300 mg Oral BID Orvan Falconer, MD   300 mg at 10/21/15 1044  . heparin injection 5,000 Units  5,000 Units Subcutaneous 3 times per day Orvan Falconer, MD   5,000 Units at 10/21/15 [phone]  . levothyroxine (SYNTHROID, LEVOTHROID) tablet 50 mcg  50 mcg Oral QAC breakfast Orvan Falconer, MD   50 mcg at 10/21/15 [phone]  . morphine 2 MG/ML injection 2 mg  2 mg Intravenous Q4H  PRN Donne Hazel, MD   2 mg at 10/19/15 1817  . mupirocin ointment (BACTROBAN) 2 % 1 application  1 application Nasal BID Orvan Falconer, MD   1 application at [phone]  . pantoprazole (PROTONIX) EC tablet 40 mg  40 mg Oral Daily Orvan Falconer, MD   40 mg at 10/21/15 1044  . simvastatin (ZOCOR) tablet 20 mg  20 mg Oral QHS Orvan Falconer, MD   20 mg at 10/20/15 2252  . sodium chloride flush (NS) 0.9 % injection 3 mL  3 mL Intravenous Q12H Orvan Falconer, MD   3 mL at 10/19/15 2323     Discharge Medications: Please see discharge summary for a list of discharge medications.  Relevant Imaging Results:  Relevant Lab Results:   Additional Information    Jaclyn Simpson, Jaclyn Pugh, LCSW

## 2015-10-21 NOTE — Care Management (Signed)
Patient requires frequent re-positioning of their body in ways that cannot be achieved with an ordinary bed or wedge pillow, to eliminate pain and the head of the bed needs to be elevated more than 30 degrees most of the time.

## 2015-10-21 NOTE — Progress Notes (Signed)
Patient with orders to be discharge to [place]. Report called to Wells Guiles, nurse. Patient stable. Patient transported via [facility].

## 2015-10-21 NOTE — Discharge Summary (Signed)
Physician Discharge Summary  Jaclyn Simpson [id number] DOB: 04-02-1917 DOA: 10/18/2015  PCP: Purvis Kilts, MD  Admit date: 10/18/2015 Discharge date: 10/21/2015  Time spent: 45 minutes  Recommendations for Outpatient Follow-up:  -We'll be discharged home today. -Advised to follow-up with primary care provider in 2 weeks for reassessment of her electrolytes.   Discharge Diagnoses:  Principal Problem:   Dehydration Active Problems:   Aortic valve stenosis   Hyperlipidemia   Hypothyroid   Hypokalemia   Hypochloremic alkalosis   DNR (do not resuscitate)   Acute urinary retention   Altered mental status   Hypermagnesemia   Hyponatremia   Discharge Condition: Stable and improved  Filed Weights   10/19/15 0450 10/20/15 0500 10/20/15 1556  Weight: 53.4 kg (117 lb 11.6 oz) 53.8 kg (118 lb 9.7 oz) 53.7 kg (118 lb 6.2 oz)    History of present illness:  As per Dr. Marin Comment on 2/7: Jaclyn Simpson is an 82 y.o. female with PMH of HLD and ovarian cyst presents from assisted living home with reports of altered mental status and increasing weakness onset one week ago and has increasingly worsened. Niece reports that patient may have been receiving extra doses of diuretics due to swelling in right leg. Nephew reports that she has also been constipated. At baseline patient is noted to be alert with clear speech. They also report that she has had decreased PO intake. Lab workup revealed CO2 65.1, Sodium 118; Potassium 1.6; WBC 19.4; and UA unremarkable. CT Head and CXR were both unremarkable. Hospitalist has been asked to admit for metabolic encephalopathy, severe hypokalemia, hyponatremia, and dehydration resulting in severe contracting alkalosis. Venous pH was found to be 7.52. She has been a DNR with a golden rod.  Hospital Course:   Acute metabolic encephalopathy -Suspect related to marked dehydration and hyponatremia. -Improved. -Have discussed with niece and main caregiver  the fact that she is unlikely to return to her baseline level of functioning and will likely continue to functionally deteriorate over the next few months.  Hypokalemia/hyponatremia -Resolved with IV fluids and potassium repletion. -On day of discharge potassium is 4.2 and sodium is 133.  History of left lower extremity cellulitis -Does not appear to have an active infection, no antibiotics required.  Hyperlipidemia -Stable  Procedures:  None   Consultations:  None  Discharge Instructions  Discharge Instructions    Diet - low sodium heart healthy    Complete by:  As directed      Increase activity slowly    Complete by:  As directed             Medication List    STOP taking these medications        cephALEXin 250 MG capsule  Commonly known as:  KEFLEX     furosemide 40 MG tablet  Commonly known as:  LASIX     HYDROcodone-acetaminophen 5-325 MG tablet  Commonly known as:  NORCO/VICODIN     metolazone 2.5 MG tablet  Commonly known as:  ZAROXOLYN     promethazine-codeine 6.25-10 MG/5ML syrup  Commonly known as:  PHENERGAN with CODEINE      TAKE these medications        alendronate 70 MG tablet  Commonly known as:  FOSAMAX  Take 70 mg by mouth every 7 (seven) days. Take with a full glass of water on an empty stomach. Take on Mondays     BACTROBAN 2 %  Generic drug:  mupirocin ointment  Apply 1 application topically 3 (three) times daily as needed.     CALCIUM 600+D 600-400 MG-UNIT tablet  Generic drug:  Calcium Carbonate-Vitamin D  Take 1 tablet by mouth daily.     clotrimazole-betamethasone cream  Commonly known as:  LOTRISONE  Apply 1 application topically 2 (two) times daily.     cyanocobalamin 1000 MCG/ML injection  Commonly known as:  (VITAMIN B-12)  Inject 1,000 mcg into the muscle every 30 (thirty) days.     FIBER-LAX 625 MG tablet  Generic drug:  polycarbophil  Take 1,250 mg by mouth daily.     gabapentin 300 MG capsule  Commonly  known as:  NEURONTIN  Take 300 mg by mouth 2 (two) times daily.     ICAPS AREDS 2 PO  Take 1 tablet by mouth daily.     levothyroxine 50 MCG tablet  Commonly known as:  SYNTHROID, LEVOTHROID  Take 50 mcg by mouth daily before breakfast.     lubiprostone 8 MCG capsule  Commonly known as:  AMITIZA  Take 8 mcg by mouth daily.     magnesium hydroxide 400 MG/5ML suspension  Commonly known as:  MILK OF MAGNESIA  Take 30 mLs by mouth 2 (two) times daily as needed for mild constipation.     omeprazole 20 MG capsule  Commonly known as:  PRILOSEC  Take 20 mg by mouth daily.     polyethylene glycol packet  Commonly known as:  MIRALAX / GLYCOLAX  Take 17 g by mouth daily.     senna-docusate 8.6-50 MG tablet  Commonly known as:  Senokot-S  Take 1 tablet by mouth daily.     simvastatin 20 MG tablet  Commonly known as:  ZOCOR  Take 1 tablet by mouth at bedtime.       No Known Allergies     Follow-up Information    Follow up with Purvis Kilts, MD. Schedule an appointment as soon as possible for a visit in 2 weeks.   Specialty:  Family Medicine   Contact information:   [address] [place] [id number] [phone]        The results of significant diagnostics from this hospitalization (including imaging, microbiology, ancillary and laboratory) are listed below for reference.    Significant Diagnostic Studies: Ct Head Wo Contrast  10/19/2015  CLINICAL DATA:  Altered mental status and multiple falls for a few days. History of syncope, cavernous angioma and stroke. EXAM: CT HEAD WITHOUT CONTRAST TECHNIQUE: Contiguous axial images were obtained from the base of the skull through the vertex without intravenous contrast. COMPARISON:  CT head September 27, 2015 FINDINGS: The ventricles and sulci are normal for age. No intraparenchymal hemorrhage, mass effect nor midline shift. Patchy supratentorial white matter hypodensities are less than expected for patient's age and  though non-specific suggest sequelae of chronic small vessel ischemic disease. No acute large vascular territory infarcts. No abnormal extra-axial fluid collections. Basal cisterns are patent. Moderate calcific atherosclerosis of the carotid siphons. 1 cm heterogeneously calcified mass RIGHT pons. No mass effect. No skull fracture. 1 cm lucency LEFT parietal cortex favoring vascular [place] Osteopenia. Again noted is asymmetric RIGHT muscles of mastication atrophy. The included ocular globes and orbital contents are non-suspicious. Status post bilateral ocular lens implants. Minimal RIGHT mastoid effusion. LEFT mastoid air cells are well aerated. Imaged paranasal sinuses are well-aerated. IMPRESSION: No acute intracranial process. 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 Electronically Signed   By: Sandie Ano  Bloomer M.D.   On: 10/19/2015 00:48   Ct Head Wo Contrast  09/27/2015  CLINICAL DATA:  Fall. Forehead bruising from recent fall. Initial encounter. EXAM: CT HEAD WITHOUT CONTRAST TECHNIQUE: Contiguous axial images were obtained from the base of the skull through the vertex without intravenous contrast. COMPARISON:  None. FINDINGS: Skull and Sinuses:Negative for acute fracture or destructive process. 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 There is atrophy of right muscles of mastication with no evidence of skullbase mass or foraminal erosion. Question relationship to right pons lesion. Visualized orbits: Negative. Brain: No evidence of acute infarction, hemorrhage, hydrocephalus, swelling. There is a 1 cm area of stippled calcification in the right posterior pons. This appearance can be seen from chronic ischemia or hemorrhage related to developmental venous anomaly or cavernoma. There is no surrounding edema or mass effect. Normal cerebral volume. Mild/age congruent small-vessel ischemic change in the deep cerebral white matter. IMPRESSION: 1. No  evidence of intracranial injury or fracture. 2. Incidental findings described above. Electronically Signed   By: Monte Fantasia M.D.   On: 09/27/2015 09:08   Dg Abd 2 Views  10/19/2015  CLINICAL DATA:  82 year old female with altered mental status and loss of appetite. EXAM: ABDOMEN - 2 VIEW COMPARISON:  Radiograph dated 07/16/2015 FINDINGS: The visualized portion of the lungs demonstrate emphysematous changes with bibasilar atelectasis/ scarring. No evidence of bowel obstruction. No free air. There is osteopenia with degenerative changes of the spine. No definite acute fracture. IMPRESSION: No bowel obstruction. Electronically Signed   By: Anner Crete M.D.   On: 10/19/2015 00:13    Microbiology: Recent Results (from the past 240 hour(s))  MRSA PCR Screening     Status: Abnormal   Collection Time: 10/19/15  4:50 AM  Result Value Ref Range Status   MRSA by PCR POSITIVE (A) NEGATIVE Final    Comment: RESULT CALLED TO, READ BACK BY AND VERIFIED WITH: HAMMOCK,S AT 2030 ON 10/19/2015 BY ISLEY,B        The GeneXpert MRSA Assay (FDA approved for NASAL specimens only), is one component of a comprehensive MRSA colonization surveillance program. It is not intended to diagnose MRSA infection nor to guide or monitor treatment for MRSA infections.      Labs: Basic Metabolic Panel:  Recent Labs Lab 10/18/15 2336  10/19/15 1036 10/19/15 1731 10/19/15 2231 10/20/15 0518 10/21/15 0632  NA 118*  < > 123* 123* 126* 130* 133*  K <2.0*  < > 2.9* 3.2* 3.3* 3.9 4.2  CL <65*  < > 67* 75* 80* 88* 101  CO2 52*  < > 47* 41* 38* 36* 27  GLUCOSE 144*  < > 177* 203* 156* 164* 148*  BUN 57*  < > 38* 31* 27* 24* 15  CREATININE 0.99  < > 0.66 0.66 0.65 0.65 0.54  CALCIUM 8.6*  < > 7.4* 7.4* 7.5* 7.7* 7.9*  MG 3.2*  --   --   --   --   --   --   < > = values in this interval not displayed. Liver Function Tests:  Recent Labs Lab 10/18/15 2336 10/19/15 0515 10/20/15 0518 10/21/15 [id number]  AST 31  26 19 22   ALT 18 15 14 14   ALKPHOS 85 71 70 70  BILITOT 1.9* 1.3* 0.6 0.4  PROT 7.0 5.8* 5.6* 5.6*  ALBUMIN 4.0 3.3* 3.1* 3.0*   No results for input(s): LIPASE, AMYLASE in the last 168 hours. No results for input(s): AMMONIA in the last 168 hours.  CBC:  Recent Labs Lab 10/18/15 2336 10/20/15 0518 10/21/15 0632  WBC 19.4* 6.9 8.1  NEUTROABS 17.5*  --   --   HGB 14.7 12.9 12.6  HCT 41.9 40.0 38.7  MCV 87.8 93.5 93.7  PLT 294 276 302   Cardiac Enzymes: No results for input(s): CKTOTAL, CKMB, CKMBINDEX, TROPONINI in the last 168 hours. BNP: BNP (last 3 results) No results for input(s): BNP in the last 8760 hours.  ProBNP (last 3 results) No results for input(s): PROBNP in the last 8760 hours.  CBG: No results for input(s): GLUCAP in the last 168 hours.     SignedLelon Frohlich  [facility] Pager: [phone] 10/21/2015, 10:34 AM

## 2015-10-21 NOTE — Evaluation (Signed)
Physical Therapy Evaluation Patient Details Name: Jaclyn Simpson MRN: [id number] DOB: 1917/05/11 Today's Date: 10/21/2015   History of Present Illness  82yo white female who resides at ALF, comes to [facility] after acute onset of weakness, confusion, and SOB after a fall at home. Pt admitted for dehydration. PLOF includes modified indep in all mobility, limited community distanced with a rollator. Pt's niece reports that the pt suddenly began to fall more regularly about 3 months ago, and  has had very poor oral intact food, with limited fluids in the last 2 weeks.   Clinical Impression  Pt previously very indep at baseline functional level, but today presenting with profound weakness. Pt requires heavy physical assistance for bed mobility, and transfers, and is not appropriate for gait at this time due to heavy physical needs. Pt is conversational, pleasant, and agreeable to participate with PT, but is oriented to Person and Place only, which niece reports to be as baseline. Pt will benefit from skilled PT services to address the deficits detailed in this note, in order to restore to PLOF. Due to profound change from baseline level and heavy physical need, pt is appropriate for high volume/high frequency PT services and appropriate for DC to STR/SNF once medically ready.      Follow Up Recommendations SNF    Equipment Recommendations  None recommended by PT    Recommendations for Other Services       Precautions / Restrictions Precautions Precautions: None Restrictions Weight Bearing Restrictions: No      Mobility  Bed Mobility Overal bed mobility: +2 for physical assistance;Needs Assistance Bed Mobility: Supine to Sit;Sit to Supine     Supine to sit: Max assist;+2 for physical assistance Sit to supine: Max assist;+2 for physical assistance      Transfers Overall transfer level: Needs assistance Equipment used:  (dependent style transfer) Transfers: Sit to/from Stand Sit to  Stand: +2 physical assistance;Max assist         General transfer comment: Pt is very weak and lethargic, requires maximal effort to perform.   Ambulation/Gait Ambulation/Gait assistance: +2 physical assistance;Total assist Ambulation Distance (Feet): 1 Feet         General Gait Details: attempted; requires total assist for support and for weight shift, pt unable to progress BLE without maximal effort.   Stairs            Wheelchair Mobility    Modified Rankin (Stroke Patients Only)       Balance Overall balance assessment: Needs assistance Sitting-balance support: Single extremity supported;Feet supported Sitting balance-Leahy Scale: Good     Standing balance support: Bilateral upper extremity supported;During functional activity Standing balance-Leahy Scale: Zero                               Pertinent Vitals/Pain Pain Assessment: No/denies pain    Home Living Family/patient expects to be discharged to:: Assisted living               Home Equipment: Walker - 4 wheels;Other (comment) (WC is on order. ) Additional Comments: Facility is able to provide total care for patient.     Prior Function Level of Independence: Independent with assistive device(s)         Comments: limited community distanced with rollator walker.      Hand Dominance        Extremity/Trunk Assessment   Upper Extremity Assessment: Generalized weakness  Lower Extremity Assessment: Generalized weakness         Communication   Communication: No difficulties  Cognition Arousal/Alertness:  (drowsy) Behavior During Therapy: WFL for tasks assessed/performed Overall Cognitive Status: Within Functional Limits for tasks assessed                      General Comments      Exercises        Assessment/Plan    PT Assessment Patient needs continued PT services  PT Diagnosis Generalized weakness;Difficulty walking   PT Problem List  Decreased strength;Decreased activity tolerance;Decreased balance;Decreased mobility;Cardiopulmonary status limiting activity  PT Treatment Interventions Gait training;Stair training;Functional mobility training;Therapeutic activities;Therapeutic exercise   PT Goals (Current goals can be found in the Care Plan section) Acute Rehab PT Goals Patient Stated Goal: Regain strength and indep  PT Goal Formulation: With family Time For Goal Achievement: 11/04/15 Potential to Achieve Goals: Fair    Frequency Min 3X/week   Barriers to discharge        Co-evaluation               End of Session Equipment Utilized During Treatment: Oxygen Activity Tolerance: Patient tolerated treatment well;Patient limited by fatigue;Patient limited by lethargy Patient left: in bed;with call bell/phone within reach;with bed alarm set Nurse Communication: Mobility status;Precautions         Time: [id number] PT Time Calculation (min) (ACUTE ONLY): 23 min   Charges:   PT Evaluation $PT Eval Moderate Complexity: 1 Procedure PT Treatments $Therapeutic Activity: 8-22 mins   PT G Codes:        3:03 PM, 20-Nov-2015 Etta Grandchild, PT, DPT PRN Physical Therapist at [facility] License # [id number] (wireless)  [phone] (mobile)

## 2016-05-01 ENCOUNTER — Ambulatory Visit (INDEPENDENT_AMBULATORY_CARE_PROVIDER_SITE_OTHER): Payer: Medicare Other | Admitting: Otolaryngology

## 2016-05-01 DIAGNOSIS — H900 Conductive hearing loss, bilateral: Secondary | ICD-10-CM | POA: Diagnosis not present

## 2016-05-01 DIAGNOSIS — H6123 Impacted cerumen, bilateral: Secondary | ICD-10-CM

## 2016-07-10 ENCOUNTER — Ambulatory Visit (INDEPENDENT_AMBULATORY_CARE_PROVIDER_SITE_OTHER): Payer: Medicare Other | Admitting: Otolaryngology

## 2016-07-10 DIAGNOSIS — H608X3 Other otitis externa, bilateral: Secondary | ICD-10-CM

## 2016-07-10 DIAGNOSIS — H6123 Impacted cerumen, bilateral: Secondary | ICD-10-CM | POA: Diagnosis not present

## 2016-09-14 ENCOUNTER — Ambulatory Visit (INDEPENDENT_AMBULATORY_CARE_PROVIDER_SITE_OTHER): Payer: Medicare Other | Admitting: Otolaryngology

## 2016-09-14 DIAGNOSIS — H6123 Impacted cerumen, bilateral: Secondary | ICD-10-CM | POA: Diagnosis not present

## 2016-11-16 ENCOUNTER — Ambulatory Visit (INDEPENDENT_AMBULATORY_CARE_PROVIDER_SITE_OTHER): Payer: Medicare Other | Admitting: Otolaryngology

## 2016-11-16 DIAGNOSIS — H6121 Impacted cerumen, right ear: Secondary | ICD-10-CM

## 2016-11-16 DIAGNOSIS — H903 Sensorineural hearing loss, bilateral: Secondary | ICD-10-CM

## 2017-01-18 ENCOUNTER — Ambulatory Visit (INDEPENDENT_AMBULATORY_CARE_PROVIDER_SITE_OTHER): Payer: Medicare Other | Admitting: Otolaryngology

## 2017-01-18 DIAGNOSIS — H6123 Impacted cerumen, bilateral: Secondary | ICD-10-CM

## 2017-04-19 ENCOUNTER — Ambulatory Visit (INDEPENDENT_AMBULATORY_CARE_PROVIDER_SITE_OTHER): Payer: Medicare Other | Admitting: Otolaryngology

## 2017-04-19 DIAGNOSIS — H6123 Impacted cerumen, bilateral: Secondary | ICD-10-CM

## 2017-07-19 ENCOUNTER — Ambulatory Visit (INDEPENDENT_AMBULATORY_CARE_PROVIDER_SITE_OTHER): Payer: Medicare Other | Admitting: Otolaryngology

## 2017-07-19 DIAGNOSIS — H6123 Impacted cerumen, bilateral: Secondary | ICD-10-CM

## 2017-10-25 ENCOUNTER — Ambulatory Visit (INDEPENDENT_AMBULATORY_CARE_PROVIDER_SITE_OTHER): Payer: Medicare Other | Admitting: Otolaryngology

## 2017-10-25 DIAGNOSIS — H6123 Impacted cerumen, bilateral: Secondary | ICD-10-CM | POA: Diagnosis not present

## 2018-01-31 ENCOUNTER — Ambulatory Visit (INDEPENDENT_AMBULATORY_CARE_PROVIDER_SITE_OTHER): Payer: Medicare Other | Admitting: Otolaryngology

## 2018-01-31 DIAGNOSIS — H6123 Impacted cerumen, bilateral: Secondary | ICD-10-CM | POA: Diagnosis not present

## 2018-05-02 ENCOUNTER — Ambulatory Visit (INDEPENDENT_AMBULATORY_CARE_PROVIDER_SITE_OTHER): Payer: Medicare Other | Admitting: Otolaryngology

## 2018-05-02 DIAGNOSIS — H6123 Impacted cerumen, bilateral: Secondary | ICD-10-CM | POA: Diagnosis not present

## 2018-05-09 ENCOUNTER — Ambulatory Visit (INDEPENDENT_AMBULATORY_CARE_PROVIDER_SITE_OTHER): Payer: Medicare Other | Admitting: Otolaryngology

## 2018-08-26 ENCOUNTER — Ambulatory Visit (INDEPENDENT_AMBULATORY_CARE_PROVIDER_SITE_OTHER): Payer: Medicare Other | Admitting: Otolaryngology

## 2018-08-26 DIAGNOSIS — H6123 Impacted cerumen, bilateral: Secondary | ICD-10-CM

## 2018-11-07 ENCOUNTER — Emergency Department (HOSPITAL_COMMUNITY): Payer: Medicare Other

## 2018-11-07 ENCOUNTER — Other Ambulatory Visit: Payer: Self-pay

## 2018-11-07 ENCOUNTER — Observation Stay (HOSPITAL_COMMUNITY)
Admission: EM | Admit: 2018-11-07 | Discharge: 2018-11-08 | Disposition: A | Discharge status: Home / Self Care | Payer: Medicare Other | Attending: Internal Medicine | Admitting: Internal Medicine

## 2018-11-07 ENCOUNTER — Encounter (HOSPITAL_COMMUNITY): Payer: Self-pay

## 2018-11-07 DIAGNOSIS — Z79899 Other long term (current) drug therapy: Secondary | ICD-10-CM | POA: Diagnosis not present

## 2018-11-07 DIAGNOSIS — N189 Chronic kidney disease, unspecified: Secondary | ICD-10-CM | POA: Insufficient documentation

## 2018-11-07 DIAGNOSIS — R0902 Hypoxemia: Secondary | ICD-10-CM

## 2018-11-07 DIAGNOSIS — J810 Acute pulmonary edema: Secondary | ICD-10-CM | POA: Diagnosis not present

## 2018-11-07 DIAGNOSIS — R2681 Unsteadiness on feet: Secondary | ICD-10-CM | POA: Diagnosis not present

## 2018-11-07 DIAGNOSIS — R531 Weakness: Principal | ICD-10-CM

## 2018-11-07 DIAGNOSIS — R0602 Shortness of breath: Secondary | ICD-10-CM | POA: Diagnosis present

## 2018-11-07 HISTORY — DX: Polyneuropathy, unspecified: G62.9

## 2018-11-07 HISTORY — DX: Age-related osteoporosis without current pathological fracture: M81.0

## 2018-11-07 HISTORY — DX: Disorder of thyroid, unspecified: E07.9

## 2018-11-07 HISTORY — DX: Hyperlipidemia, unspecified: E78.5

## 2018-11-07 LAB — CBC WITH DIFFERENTIAL/PLATELET
Abs Immature Granulocytes: 0.04 10*3/uL (ref 0.00–0.07)
BASOS ABS: 0 10*3/uL (ref 0.0–0.1)
BASOS PCT: 1 %
Eosinophils Absolute: 0.2 10*3/uL (ref 0.0–0.5)
Eosinophils Relative: 3 %
HCT: 32.2 % — ABNORMAL LOW (ref 36.0–46.0)
Hemoglobin: 9.7 g/dL — ABNORMAL LOW (ref 12.0–15.0)
Immature Granulocytes: 1 %
Lymphocytes Relative: 14 %
Lymphs Abs: 1.1 10*3/uL (ref 0.7–4.0)
MCH: 29.6 pg (ref 26.0–34.0)
MCHC: 30.1 g/dL (ref 30.0–36.0)
MCV: 98.2 fL (ref 80.0–100.0)
Monocytes Absolute: 0.8 10*3/uL (ref 0.1–1.0)
Monocytes Relative: 10 %
Neutro Abs: 5.8 10*3/uL (ref 1.7–7.7)
Neutrophils Relative %: 71 %
PLATELETS: 269 10*3/uL (ref 150–400)
RBC: 3.28 MIL/uL — ABNORMAL LOW (ref 3.87–5.11)
RDW: 15.6 % — ABNORMAL HIGH (ref 11.5–15.5)
WBC: 8 10*3/uL (ref 4.0–10.5)
nRBC: 0 % (ref 0.0–0.2)

## 2018-11-07 LAB — RESPIRATORY PANEL BY PCR
Adenovirus: NOT DETECTED
Bordetella pertussis: NOT DETECTED
Chlamydophila pneumoniae: NOT DETECTED
Coronavirus 229E: NOT DETECTED
Coronavirus HKU1: DETECTED — AB
Coronavirus NL63: NOT DETECTED
Coronavirus OC43: NOT DETECTED
Influenza A: NOT DETECTED
Influenza B: NOT DETECTED
Metapneumovirus: NOT DETECTED
Mycoplasma pneumoniae: NOT DETECTED
PARAINFLUENZA VIRUS 2-RVPPCR: NOT DETECTED
Parainfluenza Virus 1: NOT DETECTED
Parainfluenza Virus 3: NOT DETECTED
Parainfluenza Virus 4: NOT DETECTED
Respiratory Syncytial Virus: NOT DETECTED
Rhinovirus / Enterovirus: NOT DETECTED

## 2018-11-07 LAB — BASIC METABOLIC PANEL
Anion gap: 8 (ref 5–15)
BUN: 32 mg/dL — ABNORMAL HIGH (ref 8–23)
CO2: 29 mmol/L (ref 22–32)
Calcium: 8.5 mg/dL — ABNORMAL LOW (ref 8.9–10.3)
Chloride: 99 mmol/L (ref 98–111)
Creatinine, Ser: 0.82 mg/dL (ref 0.44–1.00)
GFR calc Af Amer: >60 mL/min (ref >60)
GFR calc non Af Amer: 59 mL/min — ABNORMAL LOW (ref >60)
Glucose, Bld: 148 mg/dL — ABNORMAL HIGH (ref 70–99)
Potassium: 4.7 mmol/L (ref 3.5–5.1)
Sodium: 136 mmol/L (ref 135–145)

## 2018-11-07 LAB — HEPATIC FUNCTION PANEL
ALT: 11 U/L (ref 0–44)
AST: 18 U/L (ref 15–41)
Albumin: 3.2 g/dL — ABNORMAL LOW (ref 3.5–5.0)
Alkaline Phosphatase: 52 U/L (ref 38–126)
Bilirubin, Direct: 0.1 mg/dL (ref 0.0–0.2)
Indirect Bilirubin: 0.3 mg/dL (ref 0.3–0.9)
TOTAL PROTEIN: 5.7 g/dL — AB (ref 6.5–8.1)
Total Bilirubin: 0.4 mg/dL (ref 0.3–1.2)

## 2018-11-07 LAB — I-STAT TROPONIN, ED: Troponin i, poc: 0.02 ng/mL (ref 0.00–0.08)

## 2018-11-07 LAB — INFLUENZA PANEL BY PCR (TYPE A & B)
INFLAPCR: NEGATIVE
Influenza B By PCR: NEGATIVE

## 2018-11-07 LAB — URINALYSIS, ROUTINE W REFLEX MICROSCOPIC
Bilirubin Urine: NEGATIVE
Glucose, UA: NEGATIVE mg/dL
Hgb urine dipstick: NEGATIVE
Ketones, ur: NEGATIVE mg/dL
LEUKOCYTE UA: NEGATIVE
NITRITE: NEGATIVE
Protein, ur: NEGATIVE mg/dL
Specific Gravity, Urine: 1.016 (ref 1.005–1.030)
pH: 5 (ref 5.0–8.0)

## 2018-11-07 LAB — TROPONIN I

## 2018-11-07 LAB — BRAIN NATRIURETIC PEPTIDE: B Natriuretic Peptide: 475 pg/mL — ABNORMAL HIGH (ref 0.0–100.0)

## 2018-11-07 MED ORDER — IOHEXOL 300 MG/ML  SOLN
75.0000 mL | Freq: Once | INTRAMUSCULAR | Status: AC | PRN
  Administered 2018-11-07: 75 mL via INTRAVENOUS

## 2018-11-07 MED ORDER — ACETAMINOPHEN 500 MG PO TABS
1000.0000 mg | ORAL_TABLET | Freq: Once | ORAL | Status: AC
  Administered 2018-11-07: 1000 mg via ORAL
  Filled 2018-11-07: qty 2

## 2018-11-07 MED ORDER — ACETAMINOPHEN 325 MG PO TABS
650.0000 mg | ORAL_TABLET | Freq: Once | ORAL | Status: AC
  Administered 2018-11-07: 650 mg via ORAL
  Filled 2018-11-07: qty 2

## 2018-11-07 MED ORDER — PANTOPRAZOLE SODIUM 40 MG IV SOLR
40.0000 mg | Freq: Two times a day (BID) | INTRAVENOUS | Status: DC
  Administered 2018-11-07 – 2018-11-08 (×2): 40 mg via INTRAVENOUS
  Filled 2018-11-07 (×2): qty 40

## 2018-11-07 MED ORDER — PANTOPRAZOLE SODIUM 40 MG IV SOLR: 40.0000 mg | INTRAVENOUS | Status: DC

## 2018-11-07 MED ORDER — IOPAMIDOL (ISOVUE-370) INJECTION 76%
75.0000 mL | Freq: Once | INTRAVENOUS | Status: AC | PRN
  Administered 2018-11-07: 75 mL via INTRAVENOUS

## 2018-11-07 MED ORDER — FUROSEMIDE 10 MG/ML IJ SOLN
20.0000 mg | Freq: Once | INTRAMUSCULAR | Status: AC
  Administered 2018-11-07: 20 mg via INTRAVENOUS
  Filled 2018-11-07: qty 2

## 2018-11-07 NOTE — Consult Note (Signed)
Medical Consultation   Jaclyn Simpson  AJO:[id number]  DOB: May 04, 1917  DOA: 11/07/2018  PCP: System, Pcp Not In   Requesting physician: Dr. Reather Converse  Reason for consultation: Heart failure exacerbation  History of Present Illness: Jaclyn Simpson is an 85 y.o. female with history of prior CVA, anemia, aortic valve stenosis, osteoporosis, dyslipidemia, GERD, and hypothyroidism who was brought to the ED with worsening shortness of breath and mild cough that has been progressive over 2 to 3 weeks.  She was treated with antibiotics to include azithromycin and Augmentin during this timeframe at her facility with no improvement noted.  She maintains some lower extremity edema on a usual basis and may have had some worsening recently, but has been taking Lasix 20 mg at home.  She is now noted to have any chest pain and maintains oxygen saturation of 88 to 92% on arrival.  In the ED, vital signs are noted to be stable and laboratory findings without any significant abnormalities aside from hemoglobin of 9.7.  Chest x-ray demonstrates some mild cardiomegaly, but no suggestion of volume overload or other acute findings.  She is currently saturating in the high 90th percentile simply on 1 L nasal cannula.  She ambulates very minimally with a walker at her functional baseline.  Her niece is at bedside to give a history and is the primary historian at this time.  20 mg of IV Lasix have been ordered in the ED and administration of this is currently pending.   Review of Systems:  ROS Cannot be obtained due to patient condition.  Past Medical History: Past Medical History:  Diagnosis Date  . Anemia, iron deficiency    normal CBC in 2012  . Aortic valve stenosis   . Cavernous angioma    Pons/cerebellum  . Cerebrovascular accident [facility]) 1975   Right brainstem  . Colles' fracture    right wrist  . Cyst of skin    right inner thigh  . Diverticulosis   . Humerus fracture    proximal  . Hyperlipidemia   . Osteoporosis   . Polyneuropathy   . Spinal stenosis   . Syncope    Echo in 12/2010-mild to moderate LVH; normal EF; mild AS and AI  . Thyroid disease     Past Surgical History: Past Surgical History:  Procedure Laterality Date  . APPENDECTOMY  1940s  . BREAST SURGERY    . CATARACT EXTRACTION     Bilateral  . COLONOSCOPY  2001  . LUMBAR SPINE SURGERY    . THYROIDECTOMY  1971   Goiter  . TOTAL ABDOMINAL HYSTERECTOMY  1940s     Allergies:  No Known Allergies   Social History:  reports that she has never smoked. She has never used smokeless tobacco. She reports that she does not drink alcohol or use drugs.   Family History: Family History  Problem Relation Age of Onset  . Stroke Mother   . Heart disease Father   . Pneumonia Brother        x2 brothers  . Heart attack Brother   . Cancer Sister        brain tumor    Physical Exam: Vitals:   11/07/18 1103 11/07/18 1107 11/07/18 1108  BP:  (!) 129/56   Pulse:  65   Resp:  20   Temp:  98.1 F (36.7 C)   TempSrc:  Oral   SpO2:  Marland Kitchen)  85% 94%  Weight: 58.1 kg    Height: 5\' 1"  (1.549 m)      Constitutional: Somnolent and hard of hearing. Eyes: PERLA, EOMI, irises appear normal, anicteric sclera,  ENMT: external ears and nose appear normal            Lips appears normal, oropharynx mucosa, tongue, posterior pharynx appear normal  Neck: neck appears normal, no masses, normal ROM, no thyromegaly, no JVD  CVS: S1-S2 clear, no murmur rubs or gallops, no LE edema, normal pedal pulses  Respiratory:  clear to auscultation bilaterally, no wheezing, rales or rhonchi. Respiratory effort normal. No accessory muscle use.  Currently on 1 L nasal cannula with no respiratory distress. Abdomen: soft nontender, nondistended, normal bowel sounds, no hepatosplenomegaly, no hernias  Musculoskeletal: : no cyanosis, clubbing or edema noted bilaterally               Psych: Cannot be fully evaluated at this  time. Skin: no rashes or lesions or ulcers, no induration or nodules   Data reviewed:  I have personally reviewed following labs and imaging studies Labs:  CBC: Recent Labs  Lab 11/07/18 1129  WBC 8.0  NEUTROABS 5.8  HGB 9.7*  HCT 32.2*  MCV 98.2  PLT 937    Basic Metabolic Panel: Recent Labs  Lab 11/07/18 1129  NA 136  K 4.7  CL 99  CO2 29  GLUCOSE 148*  BUN 32*  CREATININE 0.82  CALCIUM 8.5*   GFR Estimated Creatinine Clearance: 30.6 mL/min (by C-G formula based on SCr of 0.82 mg/dL). Liver Function Tests: Recent Labs  Lab 11/07/18 1129  AST 18  ALT 11  ALKPHOS 52  BILITOT 0.4  PROT 5.7*  ALBUMIN 3.2*   No results for input(s): LIPASE, AMYLASE in the last 168 hours. No results for input(s): AMMONIA in the last 168 hours. Coagulation profile No results for input(s): INR, PROTIME in the last 168 hours.  Cardiac Enzymes: Recent Labs  Lab 11/07/18 1129  TROPONINI <0.03   BNP: Invalid input(s): POCBNP CBG: No results for input(s): GLUCAP in the last 168 hours. D-Dimer No results for input(s): DDIMER in the last 72 hours. Hgb A1c No results for input(s): HGBA1C in the last 72 hours. Lipid Profile No results for input(s): CHOL, HDL, LDLCALC, TRIG, CHOLHDL, LDLDIRECT in the last 72 hours. Thyroid function studies No results for input(s): TSH, T4TOTAL, T3FREE, THYROIDAB in the last 72 hours.  Invalid input(s): FREET3 Anemia work up No results for input(s): VITAMINB12, FOLATE, FERRITIN, TIBC, IRON, RETICCTPCT in the last 72 hours. Urinalysis    Component Value Date/Time   COLORURINE YELLOW 10/18/2015 2352   APPEARANCEUR CLEAR 10/18/2015 2352   LABSPEC <1.005 (L) 10/18/2015 2352   PHURINE 6.5 10/18/2015 2352   GLUCOSEU NEGATIVE 10/18/2015 2352   HGBUR NEGATIVE 10/18/2015 2352   BILIRUBINUR NEGATIVE 10/18/2015 2352   KETONESUR NEGATIVE 10/18/2015 2352   PROTEINUR NEGATIVE 10/18/2015 2352   NITRITE NEGATIVE 10/18/2015 2352   LEUKOCYTESUR  NEGATIVE 10/18/2015 2352     Microbiology No results found for this or any previous visit (from the past 240 hour(s)).   Radiological Exams on Admission: Dg Chest Portable 1 View  Result Date: 11/07/2018 CLINICAL DATA:  Cough and congestion for 2 weeks. EXAM: PORTABLE CHEST 1 VIEW COMPARISON:  PA and lateral chest 07/13/2015 and 01/31/2007. FINDINGS: There is cardiomegaly. Coarsening of the pulmonary interstitium is noted. No consolidative process, pneumothorax or effusion. Aortic atherosclerosis is seen. No acute bony abnormality. Remote healed surgical neck  fracture right humerus and marked degenerative disease about both shoulders noted. IMPRESSION: No acute abnormality. Cardiomegaly. Atherosclerosis. Electronically Signed   By: Inge Rise M.D.   On: 11/07/2018 12:18    Impression/Recommendations Active Problems:   * No active hospital problems. *  1. Mild pulmonary edema with associated hypoxia possibly related to heart failure exacerbation. 2. Chronic anemia 3. Hypothyroidism 4. GERD 5. Dyslipidemia 6. Osteoporosis 7. Prior CVA 8. DNR   At this time, would not recommend hospital admission.  Agree with IV Lasix that will be administered in the ED and monitor for some diuresis.  Would even recommend increasing dose to 40 mg IV x1 to assist with diuresis.  Patient does take 20 mg of Lasix as needed for edema and should be placed on 40 mg of Lasix for the next 3 days along with 20 mEq of potassium with recommendations to recheck BMP in the next 3 to 4 days and reassess.   She could very easily be treated in the ambulatory setting and does not require 2D echocardiogram as this will not benefit her in any way.  Mild hypoxemia in a patient of advanced age is not unusual and she may usually live in the 12 to 92% oxygen saturation range at best.  Continue other medications as previously prescribed and return to ED should symptoms worsen or persist.  Discussed the plan at length with  niece at bedside who is in agreement.   Thank you for this consultation.     Time Spent: 35 minutes  Keilani Terrance D Decari Duggar DO [facility] 11/07/2018, 1:48 PM

## 2018-11-07 NOTE — ED Notes (Signed)
Applied vista collar to pt's neck, pt tolerated well.

## 2018-11-07 NOTE — H&P (Signed)
History and Physical    Jaclyn Simpson LNL:[id number] DOB: Mar 06, 1917 DOA: 11/07/2018  PCP: System, Pcp Not In   Patient coming from: Assisted living facility  I have personally briefly reviewed patient's old medical records in [place]  Chief Complaint: Shortness of breath, cough  HPI: Jaclyn Simpson is a 85 y.o. female with medical history significant for CVA, aortic valve stenosis, CKD, hypothyroidism who was brought to the ED with complaints worsening cough over the past 2 to 3 weeks.  Also reported was difficulty breathing with ambulation, patient was diagnosed with a pneumonia and has completed 2 courses of antibiotics azithromycin and Augmentin without significant improvement.  Reports lower extremity swelling that this has recently increased.  She is on Lasix 20 mg daily.  Patient is not on home O2. Niece is present at bedside and reports patient is weak, and she had a fall about 1-2 weeks, ago, and that she hit the back of her head.  She did not lose consciousness.  Patient's responses to questions are a bit delayed with some mild confusion, niece is deceased not her baseline that despite her age she is very sharp.  She lives in an assisted living facility and is independent of all ADLs except needing assistance to have her bath.  On questioning patient she denies difficulty breathing, but tells me of several new complaints, she reports 3 loose bowel movements in the ED( Nurse is aware of 1), abdominal pain that started today then she says she has always had abdominal pain, burning with urination, and black stools that started today also (niece noticed black stools too).  ED Course: Blood pressure systolic 40-814.  O2 sats 85% on room air.  Troponin 0.03, elevated BNP 475, prior to compare.  Unremarkable BMP, hgb 9.7, last checked 3 years ago baseline appears to be 12-14.  EKG is unchanged from prior.  Patient was given IV Lasix 40 mg x 2.  Hospitalist was called to admit, at  that time, admission was not felt to be needed, plan was for discharge to home with increased dose Lasix.  Later patient's daughter reported a fall, neck pain and generalized weakness,so cervical CT was done which revealed a minimally displaced C1 fracture.  CTA chest also revealed bibasilar atelectasis, small bilateral pleural effusion.  Neurosurgery was consulted recommended cervical collar.  Patient counseled follow-up with him in office in 2 weeks.  With complaints of generalized weakness, fall and new O2 requirement hospitalist was called to admit.   Review of Systems: As per HPI all other systems reviewed and negative.  Past Medical History:  Diagnosis Date  . Anemia, iron deficiency    normal CBC in 2012  . Aortic valve stenosis   . Cavernous angioma    Pons/cerebellum  . Cerebrovascular accident [facility]) 1975   Right brainstem  . Colles' fracture    right wrist  . Cyst of skin    right inner thigh  . Diverticulosis   . Humerus fracture    proximal  . Hyperlipidemia   . Osteoporosis   . Polyneuropathy   . Spinal stenosis   . Syncope    Echo in 12/2010-mild to moderate LVH; normal EF; mild AS and AI  . Thyroid disease     Past Surgical History:  Procedure Laterality Date  . APPENDECTOMY  1940s  . BREAST SURGERY    . CATARACT EXTRACTION     Bilateral  . COLONOSCOPY  2001  . LUMBAR SPINE SURGERY    .  THYROIDECTOMY  1971   Goiter  . TOTAL ABDOMINAL HYSTERECTOMY  1940s     reports that she has never smoked. She has never used smokeless tobacco. She reports that she does not drink alcohol or use drugs.  No Known Allergies  Family History  Problem Relation Age of Onset  . Stroke Mother   . Heart disease Father   . Pneumonia Brother        x2 brothers  . Heart attack Brother   . Cancer Sister        brain tumor    Prior to Admission medications   Medication Sig Start Date End Date Taking? Authorizing Provider  acetaminophen (TYLENOL) 325 MG tablet Take 650 mg by  mouth 2 (two) times daily.   Yes [provider]  alendronate (FOSAMAX) 70 MG tablet Take 70 mg by mouth every 7 (seven) days. Take with a full glass of water on an empty stomach. Take on Mondays   Yes [provider]  Calcium Carbonate-Vitamin D (CALCIUM 600+D) 600-400 MG-UNIT tablet Take 1 tablet by mouth daily.   Yes [provider]  clotrimazole-betamethasone (LOTRISONE) cream Apply 1 application topically 2 (two) times daily.   Yes [provider]  ferrous sulfate 325 (65 FE) MG tablet Take 325 mg by mouth daily with breakfast.   Yes [provider]  furosemide (LASIX) 20 MG tablet Take 20 mg by mouth daily.   Yes [provider]  gabapentin (NEURONTIN) 300 MG capsule Take 300 mg by mouth 2 (two) times daily.     Yes [provider]  levothyroxine (SYNTHROID, LEVOTHROID) 50 MCG tablet Take 50 mcg by mouth daily before breakfast.   Yes [provider]  magnesium hydroxide (MILK OF MAGNESIA) 400 MG/5ML suspension Take 30 mLs by mouth 2 (two) times daily as needed for mild constipation.   Yes [provider]  Multiple Vitamins-Minerals (ICAPS AREDS 2 PO) Take 1 tablet by mouth daily.   Yes [provider]  mupirocin ointment (BACTROBAN) 2 % Apply 1 application topically 3 (three) times daily as needed.   Yes [provider]  omeprazole (PRILOSEC) 20 MG capsule Take 20 mg by mouth daily.     Yes [provider]  polycarbophil (FIBER-LAX) 625 MG tablet Take 1,250 mg by mouth daily.   Yes [provider]  polyethylene glycol (MIRALAX / GLYCOLAX) packet Take 17 g by mouth daily.   Yes [provider]  potassium chloride (K-DUR,KLOR-CON) 10 MEQ tablet Take 10 mEq by mouth daily.   Yes [provider]  senna-docusate (SENOKOT-S) 8.6-50 MG tablet Take 1 tablet by mouth daily.   Yes [provider]  simvastatin (ZOCOR) 20 MG tablet Take 1 tablet by mouth at  bedtime. 09/23/15  Yes [provider]  cyanocobalamin (,VITAMIN B-12,) 1000 MCG/ML injection Inject 1,000 mcg into the muscle every 30 (thirty) days.     [provider]    Physical Exam: Vitals:   11/07/18 1930 11/07/18 2000 11/07/18 2030 11/07/18 2100  BP: (!) 100/46 (!) 121/55 (!) 98/48 (!) 109/44  Pulse: 62 63 61 (!) 58  Resp: 18 18 (!) 21 20  Temp:      TempSrc:      SpO2: 91% 98% 96% 96%  Weight:      Height:        Constitutional: Rigid cervical collar in place, calm, comfortable Vitals:   11/07/18 1930 11/07/18 2000 11/07/18 2030 11/07/18 2100  BP: (!) 100/46 (!) 121/55 Marland Kitchen)  98/48 (!) 109/44  Pulse: 62 63 61 (!) 58  Resp: 18 18 (!) 21 20  Temp:      TempSrc:      SpO2: 91% 98% 96% 96%  Weight:      Height:       Eyes: PERRL, lids and conjunctivae normal ENMT: Mucous membranes are dry. Posterior pharynx clear of any exudate or lesions. Neck: normal, supple, no masses, no thyromegaly Respiratory: clear to auscultation bilaterally- anteriorly, no wheezing, no crackles. Normal respiratory effort. No accessory muscle use. On 1.5L O2. Cardiovascular: Regular rate and rhythm, no murmurs / rubs / gallops. Wearing Compression stockings but no no extremity edema. 2+ pedal pulses.  Abdomen: Mild diffuse tenderness, no masses palpated. No hepatosplenomegaly. Bowel sounds positive.  Musculoskeletal: no clubbing / cyanosis. No joint deformity upper and lower extremities. Good ROM, no contractures. Normal muscle tone.  Skin: no rashes, lesions, ulcers. No induration Neurologic: CN 2-12 grossly intact. Strength 5/5 in all 4.  Psychiatric: Normal judgment and insight. Alert and oriented x 3. Normal mood.   Labs on Admission: I have personally reviewed following labs and imaging studies  CBC: Recent Labs  Lab 11/07/18 1129  WBC 8.0  NEUTROABS 5.8  HGB 9.7*  HCT 32.2*  MCV 98.2  PLT 938   Basic Metabolic Panel: Recent Labs  Lab 11/07/18 1129  NA 136  K  4.7  CL 99  CO2 29  GLUCOSE 148*  BUN 32*  CREATININE 0.82  CALCIUM 8.5*   Liver Function Tests: Recent Labs  Lab 11/07/18 1129  AST 18  ALT 11  ALKPHOS 52  BILITOT 0.4  PROT 5.7*  ALBUMIN 3.2*   Cardiac Enzymes: Recent Labs  Lab 11/07/18 1129  TROPONINI <0.03     Radiological Exams on Admission: Ct Soft Tissue Neck W Contrast  Result Date: 11/07/2018 CLINICAL DATA:  Cough and congestion for 2 weeks. EXAM: CT NECK WITH CONTRAST TECHNIQUE: Multidetector CT imaging of the neck was performed using the standard protocol following the bolus administration of intravenous contrast. CONTRAST:  74mL ISOVUE-370 IOPAMIDOL (ISOVUE-370) INJECTION 76%, 35mL OMNIPAQUE IOHEXOL 300 MG/ML SOLN COMPARISON:  None. FINDINGS: PHARYNX AND LARYNX: --Nasopharynx: Fossae of Rosenmuller are clear. Normal adenoid tonsils for age. --Oral cavity and oropharynx: The palatine and lingual tonsils are normal. The visible oral cavity and floor of mouth are normal. --Hypopharynx: Normal vallecula and pyriform sinuses. --Larynx: Normal epiglottis and pre-epiglottic space. Normal aryepiglottic and vocal folds. --Retropharyngeal space: No abscess, effusion or lymphadenopathy. SALIVARY GLANDS: --Parotid: No mass lesion or inflammation. No sialolithiasis or ductal dilatation. --Submandibular: Absent or atrophic left submandibular gland. --Sublingual: Poor visualization due to streak artifact from dental amalgam. THYROID: Status post left thyroidectomy. Subcentimeter right thyroid nodule. LYMPH NODES: No enlarged or abnormal density lymph nodes. VASCULAR: Calcific atherosclerosis of the aortic arch. Major cervical vessels are patent. LIMITED INTRACRANIAL: Normal. VISUALIZED ORBITS: Normal. MASTOIDS AND VISUALIZED PARANASAL SINUSES: Mild maxillary sinus mucosal thickening. Trace right mastoid fluid. SKELETON: There is a minimally displaced fracture through the right anterior aspect of the C1 ring. UPPER CHEST: Biapical scarring.  OTHER: Severe atrophy of the right pterygoid muscles. IMPRESSION: 1. Minimally displaced fracture of the right aspect of the C1 ring. In the absence of trauma, this is age-indeterminate, though the features are more in keeping with a recent fracture. 2. No other potentially acute abnormality of the neck. 3. Chronic atrophy of the right pterygoid muscles, of indeterminate etiology. This is likely secondary to denervation. 4. Small amount of right  mastoid fluid. 5.  Aortic atherosclerosis (ICD10-I70.0). These results were called by telephone at the time of interpretation on 11/07/2018 at 7:49 pm to the patient's physician, who verbally acknowledged these results. Electronically Signed   By: Ulyses Jarred M.D.   On: 11/07/2018 19:52   Ct Angio Chest Pe W And/or Wo Contrast  Result Date: 11/07/2018 CLINICAL DATA:  Cough, congestion EXAM: CT ANGIOGRAPHY CHEST WITH CONTRAST TECHNIQUE: Multidetector CT imaging of the chest was performed using the standard protocol during bolus administration of intravenous contrast. Multiplanar CT image reconstructions and MIPs were obtained to evaluate the vascular anatomy. CONTRAST:  92mL ISOVUE-370 IOPAMIDOL (ISOVUE-370) INJECTION 76%, 69mL OMNIPAQUE IOHEXOL 300 MG/ML SOLN COMPARISON:  None. FINDINGS: Cardiovascular: No filling defects in the pulmonary arteries to suggest pulmonary emboli. Cardiomegaly. Coronary artery and aortic atherosclerosis. No evidence of aortic aneurysm. Mediastinum/Nodes: No mediastinal, hilar, or axillary adenopathy. Lungs/Pleura: Small bilateral pleural effusions. Bibasilar opacities, likely atelectasis. Upper Abdomen: Imaging into the upper abdomen shows no acute findings. Calcifications in the spleen compatible with old granulomatous disease. Musculoskeletal: Chest wall soft tissues are unremarkable. No acute bony abnormality. Review of the MIP images confirms the above findings. IMPRESSION: Cardiomegaly.  Coronary artery disease. No evidence of pulmonary  embolus. Small bilateral pleural effusions with bibasilar atelectasis. Aortic Atherosclerosis (ICD10-I70.0). Electronically Signed   By: Rolm Baptise M.D.   On: 11/07/2018 19:37   Dg Chest Portable 1 View  Result Date: 11/07/2018 CLINICAL DATA:  Cough and congestion for 2 weeks. EXAM: PORTABLE CHEST 1 VIEW COMPARISON:  PA and lateral chest 07/13/2015 and 01/31/2007. FINDINGS: There is cardiomegaly. Coarsening of the pulmonary interstitium is noted. No consolidative process, pneumothorax or effusion. Aortic atherosclerosis is seen. No acute bony abnormality. Remote healed surgical neck fracture right humerus and marked degenerative disease about both shoulders noted. IMPRESSION: No acute abnormality. Cardiomegaly. Atherosclerosis. Electronically Signed   By: Inge Rise M.D.   On: 11/07/2018 12:18    EKG: Independently reviewed.  Sinus rhythm with old RBBB, LAFB.   Assessment/Plan Active Problems:   Generalized weakness   Generalized weakness- Likely from recent illness (-recent pneumonia diagnosis, completed 2 courses of antibiotics) and advanced age. CTA chest today -small bilateral pleural effusion, bibasilar atelectasis.  Afebrile.  WBC 8.  Reports dysuria. - PT eval - UA - respiratory panel pending -Check influenza panel -Mucolytic's for persistent cough  New O2 requirement-O2 sats 85% on initial admit.  Patient currently on 1.5 L O2 sats 95%.  CTA chest results do not explain hypoxia.  Possible mild hypoxia from advanced age. -Assess O2 requirements in a.m. -Case manager consult  Chronic lower extremity swelling-at the time of my evaluation patient has no lower extremity swelling.  But her legs have been up.  BNP - 475, no prior to compare. IV Lasix 401 given in ED.  She appears a bit dehydrated.  -Will hold off on further Lasix dosing, with soft blood pressure -Cont compression stockings -Hold home p.o. Lasix -Continue K supplementation  Anemia- Hgb 9.7, 3 years ago baseline  12-14.  Reports black stools today.  Soft blood pressure. -Repeat CBC in a.m. - Protonix 40 IV twice daily -Anemia panel in a.m  Fall-mechanical, when patient sat on the controls of her lift chair.  Cervical CT issues C1 fracture.  Neurosurgery recommends cervical collar, may follow-up in 2 weeks . -Continue cervical collar -Head CT -Tylenol for pain  CVA-stable -Continue home simvastatin   DVT prophylaxis: Scds Code Status: DNR-form at bedside.  Confirmed with niece at  bedside. Family Communication: Niece Benjamine Mola at bedside Disposition Plan: 1-2 days Consults called: None Admission status: Obs, med-surg   Bethena Roys MD [facility]  11/07/2018, 10:44 PM

## 2018-11-07 NOTE — ED Triage Notes (Signed)
Ems reports pt  Is a resident of [facility] and has had cough and congestion x 2 weeks.  Has been on antibiotics but is no better.  EMS says pt has congested cough and feels warm to touch.  Denies pain.  Pt alert and oriented.  Reports is HOH.  Pt had breathing treatment at [facility].

## 2018-11-07 NOTE — ED Provider Notes (Signed)
[facility] EMERGENCY DEPARTMENT Provider Note   CSN: [id number] Arrival date & time: 11/07/18  1057    History   Chief Complaint Chief Complaint  Patient presents with  . Cough    HPI Jaclyn Simpson is a 85 y.o. female.     Patient with history of aortic valve stenosis, stroke, anemia presents with worsening shortness of breath and cough for the past 2 to 3 weeks.  Patient been treated with antibiotics twice and other supportive medications without improvement.  No history of congestive heart failure known.  Patient has had intermittent swelling in the legs.  Patient denies chest pain.  Patient was upper 80s in the office and sent over with nasal cannula oxygenation.  No lung disease history.  No home oxygen.     Past Medical History:  Diagnosis Date  . Anemia, iron deficiency    normal CBC in 2012  . Aortic valve stenosis   . Cavernous angioma    Pons/cerebellum  . Cerebrovascular accident [facility]) 1975   Right brainstem  . Colles' fracture    right wrist  . Cyst of skin    right inner thigh  . Diverticulosis   . Humerus fracture    proximal  . Hyperlipidemia   . Osteoporosis   . Polyneuropathy   . Spinal stenosis   . Syncope    Echo in 12/2010-mild to moderate LVH; normal EF; mild AS and AI  . Thyroid disease     Patient Active Problem List   Diagnosis Date Noted  . Dehydration 10/19/2015  . Hypokalemia 10/19/2015  . Hypochloremic alkalosis 10/19/2015  . DNR (do not resuscitate) 10/19/2015  . Acute urinary retention   . Altered mental status   . Hypermagnesemia   . Hyponatremia   . Pelvic mass in female 12/22/2013  . 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 Metatarsal fracture 01/01/2013  . Chronic kidney disease 08/01/2011  . Hyperlipidemia 08/01/2011  . Gastroesophageal reflux disease 08/01/2011  . Osteoporosis 08/01/2011  . Hypothyroid 08/01/2011  . Laboratory test 08/01/2011  . Spinal stenosis   . Edema extremities 07/21/2011  .  Syncope   . Aortic valve stenosis   . Cerebrovascular accident [facility]) 09/30/2009    Past Surgical History:  Procedure Laterality Date  . APPENDECTOMY  1940s  . BREAST SURGERY    . CATARACT EXTRACTION     Bilateral  . COLONOSCOPY  2001  . LUMBAR SPINE SURGERY    . THYROIDECTOMY  1971   Goiter  . TOTAL ABDOMINAL HYSTERECTOMY  1940s     OB History   No obstetric history on file.      Home Medications    Prior to Admission medications   Medication Sig Start Date End Date Taking? Authorizing Provider  acetaminophen (TYLENOL) 325 MG tablet Take 650 mg by mouth 2 (two) times daily.   Yes [provider]  alendronate (FOSAMAX) 70 MG tablet Take 70 mg by mouth every 7 (seven) days. Take with a full glass of water on an empty stomach. Take on Mondays   Yes [provider]  Calcium Carbonate-Vitamin D (CALCIUM 600+D) 600-400 MG-UNIT tablet Take 1 tablet by mouth daily.   Yes [provider]  clotrimazole-betamethasone (LOTRISONE) cream Apply 1 application topically 2 (two) times daily.   Yes [provider]  ferrous sulfate 325 (65 FE) MG tablet Take 325 mg by mouth daily with breakfast.   Yes [provider]  furosemide (LASIX) 20 MG tablet Take 20  mg by mouth daily.   Yes [provider]  gabapentin (NEURONTIN) 300 MG capsule Take 300 mg by mouth 2 (two) times daily.     Yes [provider]  levothyroxine (SYNTHROID, LEVOTHROID) 50 MCG tablet Take 50 mcg by mouth daily before breakfast.   Yes [provider]  magnesium hydroxide (MILK OF MAGNESIA) 400 MG/5ML suspension Take 30 mLs by mouth 2 (two) times daily as needed for mild constipation.   Yes [provider]  Multiple Vitamins-Minerals (ICAPS AREDS 2 PO) Take 1 tablet by mouth daily.   Yes [provider]  mupirocin ointment (BACTROBAN) 2 % Apply 1 application topically 3 (three) times daily as needed.   Yes [provider]  omeprazole  (PRILOSEC) 20 MG capsule Take 20 mg by mouth daily.     Yes [provider]  polycarbophil (FIBER-LAX) 625 MG tablet Take 1,250 mg by mouth daily.   Yes [provider]  polyethylene glycol (MIRALAX / GLYCOLAX) packet Take 17 g by mouth daily.   Yes [provider]  potassium chloride (K-DUR,KLOR-CON) 10 MEQ tablet Take 10 mEq by mouth daily.   Yes [provider]  senna-docusate (SENOKOT-S) 8.6-50 MG tablet Take 1 tablet by mouth daily.   Yes [provider]  simvastatin (ZOCOR) 20 MG tablet Take 1 tablet by mouth at bedtime. 09/23/15  Yes [provider]  cyanocobalamin (,VITAMIN B-12,) 1000 MCG/ML injection Inject 1,000 mcg into the muscle every 30 (thirty) days.     [provider]    Family History Family History  Problem Relation Age of Onset  . Stroke Mother   . Heart disease Father   . Pneumonia Brother        x2 brothers  . Heart attack Brother   . Cancer Sister        brain tumor    Social History Social History   Tobacco Use  . Smoking status: Never Smoker  . Smokeless tobacco: Never Used  Substance Use Topics  . Alcohol use: No  . Drug use: No     Allergies   Patient has no known allergies.   Review of Systems Review of Systems  Constitutional: Negative for chills and fever.  HENT: Positive for congestion.   Eyes: Negative for visual disturbance.  Respiratory: Positive for cough and shortness of breath.   Cardiovascular: Negative for chest pain.  Gastrointestinal: Negative for abdominal pain and vomiting.  Genitourinary: Negative for dysuria and flank pain.  Musculoskeletal: Negative for back pain, neck pain and neck stiffness.  Skin: Negative for rash.  Neurological: Negative for light-headedness and headaches.     Physical Exam Updated Vital Signs BP (!) 129/56 (BP Location: Left Arm)   Pulse 65   Temp 98.1 F (36.7 C) (Oral)   Resp 20   Ht 5\' 1"  (1.549 m)   Wt 58.1 kg   SpO2 94%    BMI 24.19 kg/m   Physical Exam Vitals signs and nursing note reviewed.  Constitutional:      Appearance: She is well-developed.  HENT:     Head: Normocephalic and atraumatic.     Comments: TM mild injection, no fluid, no drainage, no mastoid tenderness, Mild swelling right palate, congestion Eyes:     General:        Right eye: No discharge.        Left eye: No discharge.     Conjunctiva/sclera: Conjunctivae normal.  Neck:     Musculoskeletal: Normal range of motion  and neck supple.     Trachea: No tracheal deviation.  Cardiovascular:     Rate and Rhythm: Normal rate and regular rhythm.  Pulmonary:     Effort: Pulmonary effort is normal.     Breath sounds: Rales present.  Abdominal:     General: There is no distension.     Palpations: Abdomen is soft.     Tenderness: There is no abdominal tenderness. There is no guarding.  Skin:    General: Skin is warm.     Findings: No rash.  Neurological:     Mental Status: She is alert and oriented to person, place, and time.      ED Treatments / Results  Labs (all labs ordered are listed, but only abnormal results are displayed) Labs Reviewed  CBC WITH DIFFERENTIAL/PLATELET - Abnormal; Notable for the following components:      Result Value   RBC 3.28 (*)    Hemoglobin 9.7 (*)    HCT 32.2 (*)    RDW 15.6 (*)    All other components within normal limits  BASIC METABOLIC PANEL - Abnormal; Notable for the following components:   Glucose, Bld 148 (*)    BUN 32 (*)    Calcium 8.5 (*)    GFR calc non Af Amer 59 (*)    All other components within normal limits  BRAIN NATRIURETIC PEPTIDE - Abnormal; Notable for the following components:   B Natriuretic Peptide 475.0 (*)    All other components within normal limits  HEPATIC FUNCTION PANEL - Abnormal; Notable for the following components:   Total Protein 5.7 (*)    Albumin 3.2 (*)    All other components within normal limits  RESPIRATORY PANEL BY PCR  TROPONIN I  I-STAT  TROPONIN, ED    EKG None  Radiology Dg Chest Portable 1 View  Result Date: 11/07/2018 CLINICAL DATA:  Cough and congestion for 2 weeks. EXAM: PORTABLE CHEST 1 VIEW COMPARISON:  PA and lateral chest 07/13/2015 and 01/31/2007. FINDINGS: There is cardiomegaly. Coarsening of the pulmonary interstitium is noted. No consolidative process, pneumothorax or effusion. Aortic atherosclerosis is seen. No acute bony abnormality. Remote healed surgical neck fracture right humerus and marked degenerative disease about both shoulders noted. IMPRESSION: No acute abnormality. Cardiomegaly. Atherosclerosis. Electronically Signed   By: Inge Rise M.D.   On: 11/07/2018 12:18    Procedures Procedures (including critical care time)  Medications Ordered in ED Medications  furosemide (LASIX) injection 20 mg (20 mg Intravenous Given 11/07/18 1324)  furosemide (LASIX) injection 20 mg (20 mg Intravenous Given 11/07/18 1430)     Initial Impression / Assessment and Plan / ED Course  I have reviewed the triage vital signs and the nursing notes.  Pertinent labs & imaging results that were available during my care of the patient were reviewed by me and considered in my medical decision making (see chart for details).       Patient presents with gradually worsening shortness of breath and cough for 3 weeks despite completing antibiotic courses.  Concern clinically for mild pulmonary edema with crackles at the bases and worsening symptoms along with her age.  Chest x-ray reviewed no significant findings.  BNP elevated.  Blood work reviewed chronic anemia.  Plan for admission/observation to the hospital for further work-up including echo.  Patient stable on 1 L nasal cannula. IV lasix ordered.  Discussed the case with the hospitalist who recommended seeing how patient does after IV Lasix and if patient can maintain  saturations near or above 90% then we can do oral Lasix and outpatient follow-up.  On reassessment  patient/niece discussing her worsening right throat/ear pain.  On exam patient does have mild swelling in the right side plan for CT scan to look for any abscess and more details of her lungs which may be contributing to her congestion recurrent cough/ hypoxia and mild voice change per family member.  Patient care will be signed out to reassess pulse ox and either discharged with oral Lasix with close outpatient follow-up versus admit to the hospitalist.  The patients results and plan were reviewed and discussed.   Any x-rays performed were independently reviewed by myself.   Differential diagnosis were considered with the presenting HPI.  Medications  furosemide (LASIX) injection 20 mg (20 mg Intravenous Given 11/07/18 1324)  furosemide (LASIX) injection 20 mg (20 mg Intravenous Given 11/07/18 1430)    Vitals:   11/07/18 1103 11/07/18 1107 11/07/18 1108  BP:  (!) 129/56   Pulse:  65   Resp:  20   Temp:  98.1 F (36.7 C)   TempSrc:  Oral   SpO2:  (!) 85% 94%  Weight: 58.1 kg    Height: 5\' 1"  (1.549 m)      Final diagnoses:  Hypoxia  Acute pulmonary edema (HCC)  Acute pharyngitis    Final Clinical Impressions(s) / ED Diagnoses   Final diagnoses:  Hypoxia  Acute pulmonary edema [facility])    ED Discharge Orders    None       Elnora Morrison, MD 11/07/18 1501

## 2018-11-07 NOTE — ED Notes (Addendum)
Did not ambulate pt.  o2 was 82% on room air, pt was supine in bed.   1L o2 via [place] per Dr. Reather Converse.  Current o2 is 92%.

## 2018-11-07 NOTE — ED Provider Notes (Signed)
Patient remains mildly hypoxic without oxygen.  She continues to have a cough and weakness.  Also CT scan of the chest showed some infiltrates in the bases which could be pneumonia or atelectasis.  CT scan of the neck showed a C1 fracture.  I spoke with the family and the patient did fall recently and hit the back of her neck.  No loss of consciousness.  I called neurosurgery who stated the patient can be put on a collar and followed up in 2 weeks.  She will be admitted to medicine for hypoxia   Milton Ferguson, MD 11/07/18 2133

## 2018-11-08 ENCOUNTER — Encounter (HOSPITAL_COMMUNITY): Payer: Self-pay | Admitting: *Deleted

## 2018-11-08 ENCOUNTER — Other Ambulatory Visit: Payer: Self-pay

## 2018-11-08 DIAGNOSIS — R531 Weakness: Secondary | ICD-10-CM | POA: Diagnosis not present

## 2018-11-08 LAB — RETICULOCYTES
Immature Retic Fract: 31.4 % — ABNORMAL HIGH (ref 2.3–15.9)
RBC.: 3.41 MIL/uL — ABNORMAL LOW (ref 3.87–5.11)
Retic Count, Absolute: 167.1 10*3/uL (ref 19.0–186.0)
Retic Ct Pct: 4.9 % — ABNORMAL HIGH (ref 0.4–3.1)

## 2018-11-08 LAB — CBC
HCT: 33.4 % — ABNORMAL LOW (ref 36.0–46.0)
Hemoglobin: 10.1 g/dL — ABNORMAL LOW (ref 12.0–15.0)
MCH: 29.6 pg (ref 26.0–34.0)
MCHC: 30.2 g/dL (ref 30.0–36.0)
MCV: 97.9 fL (ref 80.0–100.0)
Platelets: 279 10*3/uL (ref 150–400)
RBC: 3.41 MIL/uL — ABNORMAL LOW (ref 3.87–5.11)
RDW: 15.8 % — ABNORMAL HIGH (ref 11.5–15.5)
WBC: 9 10*3/uL (ref 4.0–10.5)
nRBC: 0 % (ref 0.0–0.2)

## 2018-11-08 LAB — IRON AND TIBC
Iron: 21 ug/dL — ABNORMAL LOW (ref 28–170)
Saturation Ratios: 8 % — ABNORMAL LOW (ref 10.4–31.8)
TIBC: 267 ug/dL (ref 250–450)
UIBC: 246 ug/dL

## 2018-11-08 LAB — FERRITIN: Ferritin: 60 ng/mL (ref 11–307)

## 2018-11-08 LAB — VITAMIN B12: Vitamin B-12: 1082 pg/mL — ABNORMAL HIGH (ref 180–914)

## 2018-11-08 LAB — FOLATE: FOLATE: 37.6 ng/mL (ref >5.9)

## 2018-11-08 MED ORDER — SIMVASTATIN 20 MG PO TABS: 20.0000 mg | ORAL_TABLET | Freq: Every day | ORAL | Status: DC

## 2018-11-08 MED ORDER — ONDANSETRON HCL 4 MG/2ML IJ SOLN: 4.0000 mg | Freq: Four times a day (QID) | INTRAMUSCULAR | Status: DC | PRN

## 2018-11-08 MED ORDER — GUAIFENESIN-DM 100-10 MG/5ML PO SYRP
10.0000 mL | ORAL_SOLUTION | Freq: Three times a day (TID) | ORAL | Status: DC
  Administered 2018-11-08 (×2): 10 mL via ORAL
  Filled 2018-11-08 (×2): qty 10

## 2018-11-08 MED ORDER — GABAPENTIN 300 MG PO CAPS
300.0000 mg | ORAL_CAPSULE | Freq: Two times a day (BID) | ORAL | Status: DC
  Administered 2018-11-08: 300 mg via ORAL
  Filled 2018-11-08: qty 1

## 2018-11-08 MED ORDER — ACETAMINOPHEN 650 MG RE SUPP: 650.0000 mg | Freq: Four times a day (QID) | RECTAL | Status: DC | PRN

## 2018-11-08 MED ORDER — FERROUS SULFATE 325 (65 FE) MG PO TABS
325.0000 mg | ORAL_TABLET | Freq: Every day | ORAL | Status: DC
  Administered 2018-11-08: 325 mg via ORAL
  Filled 2018-11-08: qty 1

## 2018-11-08 MED ORDER — POTASSIUM CHLORIDE CRYS ER 10 MEQ PO TBCR
10.0000 meq | EXTENDED_RELEASE_TABLET | Freq: Every day | ORAL | Status: DC
  Administered 2018-11-08: 10 meq via ORAL
  Filled 2018-11-08: qty 1

## 2018-11-08 MED ORDER — ACETAMINOPHEN 325 MG PO TABS
650.0000 mg | ORAL_TABLET | Freq: Four times a day (QID) | ORAL | Status: DC | PRN
  Administered 2018-11-08 (×2): 650 mg via ORAL
  Filled 2018-11-08 (×2): qty 2

## 2018-11-08 MED ORDER — ONDANSETRON HCL 4 MG PO TABS: 4.0000 mg | ORAL_TABLET | Freq: Four times a day (QID) | ORAL | Status: DC | PRN

## 2018-11-08 MED ORDER — LEVOTHYROXINE SODIUM 50 MCG PO TABS
50.0000 ug | ORAL_TABLET | Freq: Every day | ORAL | Status: DC
  Administered 2018-11-08: 50 ug via ORAL
  Filled 2018-11-08: qty 1

## 2018-11-08 NOTE — Plan of Care (Signed)
  Problem: Acute Rehab PT Goals(only PT should resolve) Goal: Pt Will Go Supine/Side To Sit Outcome: Progressing Flowsheets (Taken 11/08/2018 1216) Pt will go Supine/Side to Sit: with minimal assist Goal: Patient Will Transfer Sit To/From Stand Outcome: Progressing Flowsheets (Taken 11/08/2018 1216) Patient will transfer sit to/from stand: with minimal assist Goal: Pt Will Transfer Bed To Chair/Chair To Bed Outcome: Progressing Flowsheets (Taken 11/08/2018 1216) Pt will Transfer Bed to Chair/Chair to Bed: with min assist Goal: Pt Will Ambulate Outcome: Progressing Flowsheets (Taken 11/08/2018 1216) Pt will Ambulate: 15 feet; with moderate assist; with rolling walker   12:16 PM, 11/08/18 Lonell Grandchild, MPT Physical Therapist with [facility] [phone] office [phone] mobile phone

## 2018-11-08 NOTE — Clinical Social Work Note (Signed)
Patient Information   Patient Name Jaclyn Simpson, Jaclyn Simpson ([id number]) Sex Female DOB Apr 02, 1917 SSN [id number]  Room Bed  A302 A302-01  Patient Demographics   Address [place] [address] Phone [phone] (Home)  Patient Ethnicity & Race   Ethnic Group Patient Race  Not Hispanic or Latino White or Caucasian  Emergency Contact(s)   Name Relation Home Work [place] Niece [phone]  [phone]  Ruben Im [phone]  [phone]  Documents on File    Status Date Received Description  Documents for the Patient  EMR Medication Summary Not Received    EMR Problem Summary Not Received    EMR Patient Summary Not Received    Driver's License Not Received    Driver's License Not Received    Historic Radiology Documentation Not Received    Historic Radiology Documentation Not Received    [place] Received 01/03/11   [place] E-Signature HIPAA Notice of Privacy Received 12/29/10   [place] E-Signature HIPAA Notice of Privacy Spanish Not Received    Insurance Card Received 12/29/10   Advance Directives/Living Will/HCPOA/POA Not Received    Insurance Card Not Received  old card  [place] HIPAA NOTICE OF PRIVACY - Scanned Not Received    Financial Application Not Received    Insurance Card Not Received  old card  Insurance Card Not Received  old card  Insurance Card Received 01/01/13 Medicare/[employer]  Insurance Card Not Received  old card  [place] Received 01/01/13   [place] Not Received    Advance Directives/Living Will/HCPOA/POA Not Received    AMB Correspondence Not Received  04/14 office note [place] Notice (ABN) Not Received  [facility] waiv/12-22-13  Insurance Card Received 09/15/13 [facility] [employer]/FT  Insurance Card Received 09/15/13 Medicare Waiver 1/15/FT  AMB  Correspondence Not Received  12/14 office note Mann PA, B.  Insurance Card Received 10/15/13 Medicare Waiver 10/2013/FT  Release of Information Received 10/15/13 Do not release info [employer]   Insurance Card  12/23/13 Wrong selection/sn  Release of Information Received 12/23/13 Malissa Hippo [place]/POA/FT  Advance Directives/Living Will/HCPOA/POA     Advance Directives/Living Will/HCPOA/POA Received 12/24/13 Malissa Hippo [place]/FT  E-Signature AOB Spanish Not Received    Other Photo ID Not Received    Advanced Beneficiary Notice (ABN)   Medicare [place] 06/30/2015  Advance Directives/Living Will/HCPOA/POA  10/22/15   Advance Directives/Living Will/HCPOA/POA  10/22/15   Insurance Card Received 07/19/17 2018 [employer] [place] E-Signature HIPAA Notice of Privacy Signed 11/07/18   AMB Intake Forms/Questionnaires Not Received (Deleted)    Documents for the Encounter  AOB (Assignment of Insurance Benefits) Not Received    E-signature AOB Signed 11/07/18   MEDICARE RIGHTS Not Received    E-signature Medicare Rights Signed 11/07/18   ED Patient Billing Extract   ED PB Billing Extract  EKG Received 11/08/18   Admission Information   Current Information   Attending Provider Admitting Provider Admission Type Admission Status  Rodena Goldmann, DO Bethena Roys, MD Emergency Admission (Confirmed)       Admission Date/Time Discharge Date Hospital Service Auth/Cert Status  35/70/17 10:57 AM  Internal Medicine [place] Unit Room/Bed   [facility] AP-DEPT 300 A302/A302-01        Admission   Complaint  cough  Hospital Account   Name Acct ID Class Status Primary Coverage  Miana, [place] [id number] Observation Open MEDICARE - MEDICARE PART A AND B      Guarantor Account (for Hospital Account [id number])   Name Relation to Pt Service Area Active? Acct Type  Bennette, Kennis Carina Self CHSA Yes Personal/Family   Address Phone    [address] [place], [place] [address] [phone])        Coverage Information (for Hospital Account [id number])   1. [place] PART A AND B   F/O Payor/Plan Precert #  MEDICARE/MEDICARE PART A AND B   Subscriber Subscriber #  Dorice, [place] [id number]  Address Phone  [address], [place] [address]   2. [employer] LIFE/B[employer]ERS LIFE [facility] SUP   F/O Payor/Plan Precert #  [facility] LIFE [facility] SUP   Subscriber Subscriber #  Kitty, [place] [id number]  [place] [address] [phone]       Care Everywhere ID:  [id number]

## 2018-11-08 NOTE — Discharge Summary (Signed)
Physician Discharge Summary  Jaclyn Simpson SEG:[id number] DOB: 1917-01-12 DOA: 11/07/2018  PCP: System, Pcp Not In  Admit date: 11/07/2018  Discharge date: 11/08/2018  Admitted From:ALF  Disposition:  ALF  Recommendations for Outpatient Follow-up:  1. Follow-up with neurosurgery for evaluation of c-collar removal in 2 weeks as recommended due to minimally displaced cervical fracture at C1 2. Recommendations to initiate hospice care at ALF due to failure to thrive and prognosis of less than 6 months for survival.  Discussed with family members were in agreement  Lares: None  Equipment/Devices: None  Discharge Condition: Stable  CODE STATUS: DNR  Diet recommendation: Regular/pleasure feeds  Brief/Interim Summary: Per HPI: Jaclyn Simpson is a 85 y.o. female with medical history significant for CVA, aortic valve stenosis, CKD, hypothyroidism who was brought to the ED with complaints worsening cough over the past 2 to 3 weeks.  Also reported was difficulty breathing with ambulation, patient was diagnosed with a pneumonia and has completed 2 courses of antibiotics azithromycin and Augmentin without significant improvement.  Reports lower extremity swelling that this has recently increased.  She is on Lasix 20 mg daily.  Patient is not on home O2. Niece is present at bedside and reports patient is weak, and she had a fall about 1-2 weeks, ago, and that she hit the back of her head.  She did not lose consciousness.  Patient's responses to questions are a bit delayed with some mild confusion, niece is deceased not her baseline that despite her age she is very sharp.  She lives in an assisted living facility and is independent of all ADLs except needing assistance to have her bath.  On questioning patient she denies difficulty breathing, but tells me of several new complaints, she reports 3 loose bowel movements in the ED( Nurse is aware of 1), abdominal pain that started today then she  says she has always had abdominal pain, burning with urination, and black stools that started today also (niece noticed black stools too).  Patient was admitted with generalized weakness in a setting of what appears to be failure to thrive associated with recent illness.  Further work-up has remained within normal limits with no significant anemia, UTI, or findings of pneumonia or otherwise noted.  She was noted to have an incidental C1 fracture with recent history of fall and it appears that she may be becoming significantly deconditioned due to her advanced age.  Her prognosis overall appears to be quite poor and hospice care has been discussed with her healthcare power of attorney's who agree that this would be the best course of action.  They would like for her to remain comfortable and prevent any further suffering.  They would like for her to go back to her assisted living facility with hospice evaluation and care from this point forward.  This will be arranged by social workers at this facility.  Discharge Diagnoses:  Active Problems:   Generalized weakness  Principal discharge diagnosis: Generalized weakness with recent fall and C1 fracture in the setting of failure to thrive with prognosis of less than 6 months.  Discharge Instructions  Discharge Instructions    Diet - low sodium heart healthy   Complete by:  As directed    Increase activity slowly   Complete by:  As directed      Allergies as of 11/08/2018   No Known Allergies     Medication List    TAKE these medications   acetaminophen 325 MG tablet  Commonly known as:  TYLENOL Take 650 mg by mouth 2 (two) times daily.   alendronate 70 MG tablet Commonly known as:  FOSAMAX Take 70 mg by mouth every 7 (seven) days. Take with a full glass of water on an empty stomach. Take on Mondays   BACTROBAN 2 % Generic drug:  mupirocin ointment Apply 1 application topically 3 (three) times daily as needed.   CALCIUM 600+D 600-400  MG-UNIT tablet Generic drug:  Calcium Carbonate-Vitamin D Take 1 tablet by mouth daily.   clotrimazole-betamethasone cream Commonly known as:  LOTRISONE Apply 1 application topically 2 (two) times daily.   cyanocobalamin 1000 MCG/ML injection Commonly known as:  (VITAMIN B-12) Inject 1,000 mcg into the muscle every 30 (thirty) days.   ferrous sulfate 325 (65 FE) MG tablet Take 325 mg by mouth daily with breakfast.   FIBER-LAX 625 MG tablet Generic drug:  polycarbophil Take 1,250 mg by mouth daily.   furosemide 20 MG tablet Commonly known as:  LASIX Take 20 mg by mouth daily.   gabapentin 300 MG capsule Commonly known as:  NEURONTIN Take 300 mg by mouth 2 (two) times daily.   ICAPS AREDS 2 PO Take 1 tablet by mouth daily.   levothyroxine 50 MCG tablet Commonly known as:  SYNTHROID, LEVOTHROID Take 50 mcg by mouth daily before breakfast.   magnesium hydroxide 400 MG/5ML suspension Commonly known as:  MILK OF MAGNESIA Take 30 mLs by mouth 2 (two) times daily as needed for mild constipation.   omeprazole 20 MG capsule Commonly known as:  PRILOSEC Take 20 mg by mouth daily.   polyethylene glycol packet Commonly known as:  MIRALAX / GLYCOLAX Take 17 g by mouth daily.   potassium chloride 10 MEQ tablet Commonly known as:  K-DUR,KLOR-CON Take 10 mEq by mouth daily.   senna-docusate 8.6-50 MG tablet Commonly known as:  Senokot-S Take 1 tablet by mouth daily.   simvastatin 20 MG tablet Commonly known as:  ZOCOR Take 1 tablet by mouth at bedtime.      Follow-up Information    [facility] [place] Follow up in 2 week(s).   Specialty:  Neurosurgery Contact information: [address] [place] [address] [phone]          No Known Allergies  Consultations:  None   Procedures/Studies: Ct Head Wo Contrast  Result Date: 11/07/2018 CLINICAL DATA:  Recent fall, striking back of neck. C1 fracture identified on  CT neck. EXAM: CT HEAD WITHOUT CONTRAST TECHNIQUE: Contiguous axial images were obtained from the base of the skull through the vertex without intravenous contrast. COMPARISON:  10/18/2015 FINDINGS: Brain: The patient had IV contrast material for a CT neck and chest earlier today. Residual contrast material in the blood pool, including intracranial arteries and venous structures. Residual contrast material severely limits the ability of this study to assess for acute intracranial hemorrhage. Diffuse cerebral atrophy. Low-attenuation changes in the deep white matter consistent with small vessel ischemia. Focal enhancement in the right pons/cerebellar peduncle is again demonstrated corresponding to a partially calcified lesion on the previous study. This may represent a venous cavernoma. Intra-axial location argues against done aneurysm. No change since prior studies. No mass effect or midline shift. No abnormal extra-axial fluid collections. Gray-white matter junctions are distinct. Basal cisterns are not effaced. Vascular: Intracranial arterial vascular calcifications are present. Skull: Calvarium appears intact. No acute depressed skull fractures. Sinuses/Orbits: Mucosal thickening throughout the paranasal sinuses. No acute air-fluid levels. Diffuse opacification of  right mastoid air cells. Other: Linear nondisplaced fracture identified in the right anterior arch of C1, as seen on previous CT neck study. IMPRESSION: 1. No acute intracranial abnormalities demonstrated. Chronic atrophy and small vessel ischemic changes. 2. Stable appearance of partially calcified enhancing lesion in the right pons/cerebellar peduncle likely representing a venous malformation. 3. Right mastoid effusions. 4. Nondisplaced fracture of the right anterior arch of C1 is seen on previous CT neck today. 5. Previous IV contrast administration limits ability of this examination to assess for acute intracranial hemorrhage. Electronically Signed    By: Lucienne Capers M.D.   On: 11/07/2018 23:00   Ct Soft Tissue Neck W Contrast  Result Date: 11/07/2018 CLINICAL DATA:  Cough and congestion for 2 weeks. EXAM: CT NECK WITH CONTRAST TECHNIQUE: Multidetector CT imaging of the neck was performed using the standard protocol following the bolus administration of intravenous contrast. CONTRAST:  5mL ISOVUE-370 IOPAMIDOL (ISOVUE-370) INJECTION 76%, 20mL OMNIPAQUE IOHEXOL 300 MG/ML SOLN COMPARISON:  None. FINDINGS: PHARYNX AND LARYNX: --Nasopharynx: Fossae of Rosenmuller are clear. Normal adenoid tonsils for age. --Oral cavity and oropharynx: The palatine and lingual tonsils are normal. The visible oral cavity and floor of mouth are normal. --Hypopharynx: Normal vallecula and pyriform sinuses. --Larynx: Normal epiglottis and pre-epiglottic space. Normal aryepiglottic and vocal folds. --Retropharyngeal space: No abscess, effusion or lymphadenopathy. SALIVARY GLANDS: --Parotid: No mass lesion or inflammation. No sialolithiasis or ductal dilatation. --Submandibular: Absent or atrophic left submandibular gland. --Sublingual: Poor visualization due to streak artifact from dental amalgam. THYROID: Status post left thyroidectomy. Subcentimeter right thyroid nodule. LYMPH NODES: No enlarged or abnormal density lymph nodes. VASCULAR: Calcific atherosclerosis of the aortic arch. Major cervical vessels are patent. LIMITED INTRACRANIAL: Normal. VISUALIZED ORBITS: Normal. MASTOIDS AND VISUALIZED PARANASAL SINUSES: Mild maxillary sinus mucosal thickening. Trace right mastoid fluid. SKELETON: There is a minimally displaced fracture through the right anterior aspect of the C1 ring. UPPER CHEST: Biapical scarring. OTHER: Severe atrophy of the right pterygoid muscles. IMPRESSION: 1. Minimally displaced fracture of the right aspect of the C1 ring. In the absence of trauma, this is age-indeterminate, though the features are more in keeping with a recent fracture. 2. No other  potentially acute abnormality of the neck. 3. Chronic atrophy of the right pterygoid muscles, of indeterminate etiology. This is likely secondary to denervation. 4. Small amount of right mastoid fluid. 5.  Aortic atherosclerosis (ICD10-I70.0). These results were called by telephone at the time of interpretation on 11/07/2018 at 7:49 pm to the patient's physician, who verbally acknowledged these results. Electronically Signed   By: Ulyses Jarred M.D.   On: 11/07/2018 19:52   Ct Angio Chest Pe W And/or Wo Contrast  Result Date: 11/07/2018 CLINICAL DATA:  Cough, congestion EXAM: CT ANGIOGRAPHY CHEST WITH CONTRAST TECHNIQUE: Multidetector CT imaging of the chest was performed using the standard protocol during bolus administration of intravenous contrast. Multiplanar CT image reconstructions and MIPs were obtained to evaluate the vascular anatomy. CONTRAST:  56mL ISOVUE-370 IOPAMIDOL (ISOVUE-370) INJECTION 76%, 67mL OMNIPAQUE IOHEXOL 300 MG/ML SOLN COMPARISON:  None. FINDINGS: Cardiovascular: No filling defects in the pulmonary arteries to suggest pulmonary emboli. Cardiomegaly. Coronary artery and aortic atherosclerosis. No evidence of aortic aneurysm. Mediastinum/Nodes: No mediastinal, hilar, or axillary adenopathy. Lungs/Pleura: Small bilateral pleural effusions. Bibasilar opacities, likely atelectasis. Upper Abdomen: Imaging into the upper abdomen shows no acute findings. Calcifications in the spleen compatible with old granulomatous disease. Musculoskeletal: Chest wall soft tissues are unremarkable. No acute bony abnormality. Review of the MIP images confirms the  above findings. IMPRESSION: Cardiomegaly.  Coronary artery disease. No evidence of pulmonary embolus. Small bilateral pleural effusions with bibasilar atelectasis. Aortic Atherosclerosis (ICD10-I70.0). Electronically Signed   By: Rolm Baptise M.D.   On: 11/07/2018 19:37   Dg Chest Portable 1 View  Result Date: 11/07/2018 CLINICAL DATA:  Cough and  congestion for 2 weeks. EXAM: PORTABLE CHEST 1 VIEW COMPARISON:  PA and lateral chest 07/13/2015 and 01/31/2007. FINDINGS: There is cardiomegaly. Coarsening of the pulmonary interstitium is noted. No consolidative process, pneumothorax or effusion. Aortic atherosclerosis is seen. No acute bony abnormality. Remote healed surgical neck fracture right humerus and marked degenerative disease about both shoulders noted. IMPRESSION: No acute abnormality. Cardiomegaly. Atherosclerosis. Electronically Signed   By: Inge Rise M.D.   On: 11/07/2018 12:18     Discharge Exam: Vitals:   11/08/18 0030 11/08/18 0127  BP: (!) 104/46 (!) 114/48  Pulse: (!) 59 (!) 57  Resp: 14 16  Temp:  98 F (36.7 C)  SpO2: 99% 98%   Vitals:   11/08/18 0003 11/08/18 0030 11/08/18 0127 11/08/18 0127  BP: (!) 92/49 (!) 104/46  (!) 114/48  Pulse: (!) 57 (!) 59  (!) 57  Resp: 18 14  16   Temp:    98 F (36.7 C)  TempSrc:    Rectal  SpO2: 96% 99%  98%  Weight:   55.5 kg   Height:   5\' 1"  (1.549 m)     General: Pt is alert, awake, not in acute distress Cardiovascular: RRR, S1/S2 +, no rubs, no gallops Respiratory: CTA bilaterally, no wheezing, no rhonchi Abdominal: Soft, NT, ND, bowel sounds + Extremities: no edema, no cyanosis    The results of significant diagnostics from this hospitalization (including imaging, microbiology, ancillary and laboratory) are listed below for reference.     Microbiology: Recent Results (from the past 240 hour(s))  Respiratory Panel by PCR     Status: Abnormal   Collection Time: 11/07/18 11:56 AM  Result Value Ref Range Status   Adenovirus NOT DETECTED NOT DETECTED Final   Coronavirus 229E NOT DETECTED NOT DETECTED Final    Comment: (NOTE) The Coronavirus on the Respiratory Panel, DOES NOT test for the novel  Coronavirus (2019 nCoV)    Coronavirus HKU1 DETECTED (A) NOT DETECTED Final   Coronavirus NL63 NOT DETECTED NOT DETECTED Final   Coronavirus OC43 NOT DETECTED NOT  DETECTED Final   Metapneumovirus NOT DETECTED NOT DETECTED Final   Rhinovirus / Enterovirus NOT DETECTED NOT DETECTED Final   Influenza A NOT DETECTED NOT DETECTED Final   Influenza B NOT DETECTED NOT DETECTED Final   Parainfluenza Virus 1 NOT DETECTED NOT DETECTED Final   Parainfluenza Virus 2 NOT DETECTED NOT DETECTED Final   Parainfluenza Virus 3 NOT DETECTED NOT DETECTED Final   Parainfluenza Virus 4 NOT DETECTED NOT DETECTED Final   Respiratory Syncytial Virus NOT DETECTED NOT DETECTED Final   Bordetella pertussis NOT DETECTED NOT DETECTED Final   Chlamydophila pneumoniae NOT DETECTED NOT DETECTED Final   Mycoplasma pneumoniae NOT DETECTED NOT DETECTED Final    Comment: Performed at [facility], [place]. [address] [place], [place] [address]     Labs: BNP (last 3 results) Recent Labs    11/07/18 1129  BNP 245.8*   Basic Metabolic Panel: Recent Labs  Lab 11/07/18 1129  NA 136  K 4.7  CL 99  CO2 29  GLUCOSE 148*  BUN 32*  CREATININE 0.82  CALCIUM 8.5*   Liver Function Tests: Recent  Labs  Lab 11/07/18 1129  AST 18  ALT 11  ALKPHOS 52  BILITOT 0.4  PROT 5.7*  ALBUMIN 3.2*   No results for input(s): LIPASE, AMYLASE in the last 168 hours. No results for input(s): AMMONIA in the last 168 hours. CBC: Recent Labs  Lab 11/07/18 1129 11/08/18 0458  WBC 8.0 9.0  NEUTROABS 5.8  --   HGB 9.7* 10.1*  HCT 32.2* 33.4*  MCV 98.2 97.9  PLT 269 279   Cardiac Enzymes: Recent Labs  Lab 11/07/18 1129  TROPONINI <0.03   BNP: Invalid input(s): POCBNP CBG: No results for input(s): GLUCAP in the last 168 hours. D-Dimer No results for input(s): DDIMER in the last 72 hours. Hgb A1c No results for input(s): HGBA1C in the last 72 hours. Lipid Profile No results for input(s): CHOL, HDL, LDLCALC, TRIG, CHOLHDL, LDLDIRECT in the last 72 hours. Thyroid function studies No results for input(s): TSH, T4TOTAL, T3FREE, THYROIDAB in the last 72 hours.  Invalid  input(s): FREET3 Anemia work up Recent Labs    11/08/18 0458  VITAMINB12 1,082*  FOLATE 37.6  FERRITIN 60  TIBC 267  IRON 21*  RETICCTPCT 4.9*   Urinalysis    Component Value Date/Time   COLORURINE YELLOW 11/07/2018 2214   APPEARANCEUR CLEAR 11/07/2018 2214   LABSPEC 1.016 11/07/2018 2214   PHURINE 5.0 11/07/2018 2214   GLUCOSEU NEGATIVE 11/07/2018 2214   HGBUR NEGATIVE 11/07/2018 2214   [place] NEGATIVE 11/07/2018 2214   KETONESUR NEGATIVE 11/07/2018 2214   PROTEINUR NEGATIVE 11/07/2018 2214   NITRITE NEGATIVE 11/07/2018 2214   LEUKOCYTESUR NEGATIVE 11/07/2018 2214   Sepsis Labs Invalid input(s): PROCALCITONIN,  WBC,  LACTICIDVEN Microbiology Recent Results (from the past 240 hour(s))  Respiratory Panel by PCR     Status: Abnormal   Collection Time: 11/07/18 11:56 AM  Result Value Ref Range Status   Adenovirus NOT DETECTED NOT DETECTED Final   Coronavirus 229E NOT DETECTED NOT DETECTED Final    Comment: (NOTE) The Coronavirus on the Respiratory Panel, DOES NOT test for the novel  Coronavirus (2019 nCoV)    Coronavirus HKU1 DETECTED (A) NOT DETECTED Final   Coronavirus NL63 NOT DETECTED NOT DETECTED Final   Coronavirus OC43 NOT DETECTED NOT DETECTED Final   Metapneumovirus NOT DETECTED NOT DETECTED Final   Rhinovirus / Enterovirus NOT DETECTED NOT DETECTED Final   Influenza A NOT DETECTED NOT DETECTED Final   Influenza B NOT DETECTED NOT DETECTED Final   Parainfluenza Virus 1 NOT DETECTED NOT DETECTED Final   Parainfluenza Virus 2 NOT DETECTED NOT DETECTED Final   Parainfluenza Virus 3 NOT DETECTED NOT DETECTED Final   Parainfluenza Virus 4 NOT DETECTED NOT DETECTED Final   Respiratory Syncytial Virus NOT DETECTED NOT DETECTED Final   Bordetella pertussis NOT DETECTED NOT DETECTED Final   Chlamydophila pneumoniae NOT DETECTED NOT DETECTED Final   Mycoplasma pneumoniae NOT DETECTED NOT DETECTED Final    Comment: Performed at [facility], [place].  [address], [place], [place] [address]     Time coordinating discharge: 35 minutes  SIGNED:   Rodena Goldmann, DO [facility] 11/08/2018, 1:10 PM  If 7PM-7AM, please contact night-coverage [email or web address] Password [email or web address]

## 2018-11-08 NOTE — Progress Notes (Signed)
[facility] here to pick up patient to deliver to [facility].

## 2018-11-08 NOTE — Progress Notes (Signed)
Attempted to call report to [facility]. The answering service would just hang up.

## 2018-11-08 NOTE — Clinical Social Work Note (Signed)
LCSW spoke with patient's niece, Benjamine Mola, who stated that she wanted to speak with Hospice to have them answer a few questions. Benjamine Mola advised that patient will discharge today.   Cassandra at Hospice to contact [place] to answer questions.     Toyna Erisman, Clydene Pugh, LCSW

## 2018-11-08 NOTE — Care Management (Signed)
CM consulted for home 02. Patient is from [place] and will return back with hospice services.  Hospice/O2 referral is being made by CSW who is facilitating return to [facility].

## 2018-11-08 NOTE — Evaluation (Signed)
Physical Therapy Evaluation Patient Details Name: Jaclyn Simpson MRN: [id number] DOB: 1917-03-25 Today's Date: 11/08/2018   History of Present Illness  Jaclyn Simpson is a 85 y.o. female with medical history significant for CVA, aortic valve stenosis, CKD, hypothyroidism who was brought to the ED with complaints worsening cough over the past 2 to 3 weeks.  Also reported was difficulty breathing with ambulation, patient was diagnosed with a pneumonia and has completed 2 courses of antibiotics azithromycin and Augmentin without significant improvement.  Reports lower extremity swelling that this has recently increased.  She is on Lasix 20 mg daily.  Patient is not on home O2.    Clinical Impression  Patient present with hard cervical collar on, demonstrates slow labored movement for sitting up at bedside, sit to stands and transfers due to weakness, limited to a few side steps at bedside, incontinent of stool when standing and tolerated sitting up in chair after therapy with family member present in room.  Patient O2 saturation dropped to 85% while on room air and put back on 2 LPM with O2 saturation increasing to 96%.  Patient will benefit from continued physical therapy in hospital and recommended venue below to increase strength, balance, endurance for safe ADLs and gait.    Follow Up Recommendations SNF;Supervision/Assistance - 24 hour;Supervision for mobility/OOB    Equipment Recommendations       Recommendations for Other Services       Precautions / Restrictions Precautions Precautions: Fall Precaution Comments: C1 fracture Required Braces or Orthoses: Cervical Brace Cervical Brace: Hard collar;At all times Restrictions Weight Bearing Restrictions: No      Mobility  Bed Mobility Overal bed mobility: Needs Assistance Bed Mobility: Supine to Sit     Supine to sit: Mod assist     General bed mobility comments: slow labored movement  Transfers Overall transfer level:  Needs assistance Equipment used: Rolling walker (2 wheeled) Transfers: Sit to/from [facility] Sit to Stand: Mod assist Stand pivot transfers: Mod assist;Max assist          Ambulation/Gait Ambulation/Gait assistance: Max assist Gait Distance (Feet): 3 Feet Assistive device: Rolling walker (2 wheeled) Gait Pattern/deviations: Decreased step length - right;Decreased step length - left;Decreased stride length Gait velocity: slow   General Gait Details: limited to 4-5 slow unsteady labored side steps at bedside due to weakness, desaturated to 85% while on room air with exertion  Stairs            Wheelchair Mobility    Modified Rankin (Stroke Patients Only)       Balance Overall balance assessment: Needs assistance Sitting-balance support: Feet supported;Bilateral upper extremity supported Sitting balance-Leahy Scale: Fair     Standing balance support: Bilateral upper extremity supported;During functional activity Standing balance-Leahy Scale: Poor Standing balance comment: fair/poor using RW                             Pertinent Vitals/Pain Pain Assessment: Faces Faces Pain Scale: Hurts a little bit Pain Location: neck Pain Descriptors / Indicators: Discomfort;Sore Pain Intervention(s): Limited activity within patient's tolerance;Monitored during session    Home Living Family/patient expects to be discharged to:: Assisted living               Home Equipment: Walker - 4 wheels;Wheelchair - manual      Prior Function Level of Independence: Independent with assistive device(s)         Comments: Household ambulator with  Rollator     Hand Dominance        Extremity/Trunk Assessment   Upper Extremity Assessment Upper Extremity Assessment: Generalized weakness    Lower Extremity Assessment Lower Extremity Assessment: Generalized weakness    Cervical / Trunk Assessment Cervical / Trunk Assessment: Kyphotic   Communication   Communication: HOH  Cognition Arousal/Alertness: Awake/alert Behavior During Therapy: WFL for tasks assessed/performed Overall Cognitive Status: Within Functional Limits for tasks assessed                                        General Comments      Exercises     Assessment/Plan    PT Assessment Patient needs continued PT services  PT Problem List Decreased strength;Decreased activity tolerance;Decreased balance;Decreased mobility       PT Treatment Interventions Therapeutic exercise;Gait training;Functional mobility training;Patient/family education    PT Goals (Current goals can be found in the Care Plan section)  Acute Rehab PT Goals Patient Stated Goal: return to ALF PT Goal Formulation: With patient/family Time For Goal Achievement: 11/23/18 Potential to Achieve Goals: Good    Frequency Min 3X/week   Barriers to discharge        Co-evaluation               AM-PAC PT "6 Clicks" Mobility  Outcome Measure Help needed turning from your back to your side while in a flat bed without using bedrails?: A Lot Help needed moving from lying on your back to sitting on the side of a flat bed without using bedrails?: A Lot Help needed moving to and from a bed to a chair (including a wheelchair)?: A Lot Help needed standing up from a chair using your arms (e.g., wheelchair or bedside chair)?: A Lot Help needed to walk in hospital room?: A Lot Help needed climbing 3-5 steps with a railing? : Total 6 Click Score: 11    End of Session Equipment Utilized During Treatment: Oxygen Activity Tolerance: Patient tolerated treatment well;Patient limited by fatigue Patient left: in chair;with call bell/phone within reach;with family/visitor present Nurse Communication: Mobility status PT Visit Diagnosis: Unsteadiness on feet (R26.81);Other abnormalities of gait and mobility (R26.89);Muscle weakness (generalized) (M62.81)    Time: 6269-4854 PT  Time Calculation (min) (ACUTE ONLY): 35 min   Charges:   PT Evaluation $PT Eval Moderate Complexity: 1 Mod PT Treatments $Therapeutic Activity: 23-37 mins        12:14 PM, 11/08/18 Lonell Grandchild, MPT Physical Therapist with [facility] [phone] office [phone] mobile phone

## 2018-11-08 NOTE — Clinical Social Work Note (Signed)
Jaclyn Simpson with Hospice stated that she had spoken with patient's niece, Jaclyn Simpson, answering all questions.  Jaclyn Simpson will meet with Hospice staff at 2:45 p.m. to sign admission paperwork.  Jaclyn Simpson advised that [facility] will deliver oxygen to the facility by 4 and to have patient picked up for transport at 4.     LCSW attempted to contact facility numerous times and would get a voicemail. When 0 was pushed to speak with the operator the phone would hang up each time.   Discharge summary sent to facility. No FL2 completed as patient was in observation less than 24 hours.   LCSW signing off.     Gurbani Figge, Clydene Pugh, LCSW

## 2019-05-15 ENCOUNTER — Ambulatory Visit (INDEPENDENT_AMBULATORY_CARE_PROVIDER_SITE_OTHER): Payer: Medicare Other | Admitting: Otolaryngology

## 2019-05-15 DIAGNOSIS — H6123 Impacted cerumen, bilateral: Secondary | ICD-10-CM | POA: Diagnosis not present

## 2019-08-24 ENCOUNTER — Emergency Department (HOSPITAL_COMMUNITY): Payer: Medicare Other

## 2019-08-24 ENCOUNTER — Inpatient Hospital Stay (HOSPITAL_COMMUNITY)
Admission: EM | Admit: 2019-08-24 | Discharge: 2019-09-12 | DRG: 177 | Disposition: E | Payer: Medicare Other | Source: Skilled Nursing Facility | Attending: Internal Medicine | Admitting: Internal Medicine

## 2019-08-24 ENCOUNTER — Encounter (HOSPITAL_COMMUNITY): Payer: Self-pay | Admitting: *Deleted

## 2019-08-24 ENCOUNTER — Other Ambulatory Visit: Payer: Self-pay

## 2019-08-24 DIAGNOSIS — I959 Hypotension, unspecified: Secondary | ICD-10-CM | POA: Diagnosis present

## 2019-08-24 DIAGNOSIS — Z515 Encounter for palliative care: Secondary | ICD-10-CM | POA: Diagnosis not present

## 2019-08-24 DIAGNOSIS — Z9049 Acquired absence of other specified parts of digestive tract: Secondary | ICD-10-CM | POA: Diagnosis not present

## 2019-08-24 DIAGNOSIS — J069 Acute upper respiratory infection, unspecified: Secondary | ICD-10-CM | POA: Diagnosis present

## 2019-08-24 DIAGNOSIS — U071 COVID-19: Secondary | ICD-10-CM | POA: Diagnosis present

## 2019-08-24 DIAGNOSIS — E785 Hyperlipidemia, unspecified: Secondary | ICD-10-CM

## 2019-08-24 DIAGNOSIS — I350 Nonrheumatic aortic (valve) stenosis: Secondary | ICD-10-CM | POA: Diagnosis present

## 2019-08-24 DIAGNOSIS — N1832 Chronic kidney disease, stage 3b: Secondary | ICD-10-CM | POA: Diagnosis present

## 2019-08-24 DIAGNOSIS — J9600 Acute respiratory failure, unspecified whether with hypoxia or hypercapnia: Secondary | ICD-10-CM | POA: Diagnosis present

## 2019-08-24 DIAGNOSIS — J9601 Acute respiratory failure with hypoxia: Secondary | ICD-10-CM | POA: Diagnosis present

## 2019-08-24 DIAGNOSIS — Z9089 Acquired absence of other organs: Secondary | ICD-10-CM | POA: Diagnosis not present

## 2019-08-24 DIAGNOSIS — F419 Anxiety disorder, unspecified: Secondary | ICD-10-CM | POA: Diagnosis present

## 2019-08-24 DIAGNOSIS — E890 Postprocedural hypothyroidism: Secondary | ICD-10-CM | POA: Diagnosis present

## 2019-08-24 DIAGNOSIS — D509 Iron deficiency anemia, unspecified: Secondary | ICD-10-CM | POA: Diagnosis present

## 2019-08-24 DIAGNOSIS — Z9842 Cataract extraction status, left eye: Secondary | ICD-10-CM

## 2019-08-24 DIAGNOSIS — K5909 Other constipation: Secondary | ICD-10-CM | POA: Diagnosis present

## 2019-08-24 DIAGNOSIS — H9190 Unspecified hearing loss, unspecified ear: Secondary | ICD-10-CM | POA: Diagnosis present

## 2019-08-24 DIAGNOSIS — Z9841 Cataract extraction status, right eye: Secondary | ICD-10-CM

## 2019-08-24 DIAGNOSIS — E860 Dehydration: Secondary | ICD-10-CM | POA: Diagnosis present

## 2019-08-24 DIAGNOSIS — I129 Hypertensive chronic kidney disease with stage 1 through stage 4 chronic kidney disease, or unspecified chronic kidney disease: Secondary | ICD-10-CM | POA: Diagnosis present

## 2019-08-24 DIAGNOSIS — Z90710 Acquired absence of both cervix and uterus: Secondary | ICD-10-CM

## 2019-08-24 DIAGNOSIS — Z7189 Other specified counseling: Secondary | ICD-10-CM | POA: Diagnosis not present

## 2019-08-24 DIAGNOSIS — K219 Gastro-esophageal reflux disease without esophagitis: Secondary | ICD-10-CM | POA: Diagnosis present

## 2019-08-24 DIAGNOSIS — Z823 Family history of stroke: Secondary | ICD-10-CM

## 2019-08-24 DIAGNOSIS — Z79899 Other long term (current) drug therapy: Secondary | ICD-10-CM

## 2019-08-24 DIAGNOSIS — Z66 Do not resuscitate: Secondary | ICD-10-CM | POA: Diagnosis present

## 2019-08-24 DIAGNOSIS — M810 Age-related osteoporosis without current pathological fracture: Secondary | ICD-10-CM | POA: Diagnosis present

## 2019-08-24 DIAGNOSIS — Z8673 Personal history of transient ischemic attack (TIA), and cerebral infarction without residual deficits: Secondary | ICD-10-CM

## 2019-08-24 DIAGNOSIS — Z808 Family history of malignant neoplasm of other organs or systems: Secondary | ICD-10-CM

## 2019-08-24 DIAGNOSIS — R0602 Shortness of breath: Secondary | ICD-10-CM | POA: Diagnosis present

## 2019-08-24 DIAGNOSIS — Z79890 Hormone replacement therapy (postmenopausal): Secondary | ICD-10-CM

## 2019-08-24 DIAGNOSIS — N179 Acute kidney failure, unspecified: Secondary | ICD-10-CM

## 2019-08-24 DIAGNOSIS — I10 Essential (primary) hypertension: Secondary | ICD-10-CM | POA: Diagnosis not present

## 2019-08-24 DIAGNOSIS — F0390 Unspecified dementia without behavioral disturbance: Secondary | ICD-10-CM | POA: Diagnosis present

## 2019-08-24 DIAGNOSIS — Z836 Family history of other diseases of the respiratory system: Secondary | ICD-10-CM

## 2019-08-24 DIAGNOSIS — Z8249 Family history of ischemic heart disease and other diseases of the circulatory system: Secondary | ICD-10-CM

## 2019-08-24 DIAGNOSIS — E039 Hypothyroidism, unspecified: Secondary | ICD-10-CM | POA: Diagnosis not present

## 2019-08-24 DIAGNOSIS — Z7983 Long term (current) use of bisphosphonates: Secondary | ICD-10-CM

## 2019-08-24 LAB — CBC WITH DIFFERENTIAL/PLATELET
Abs Immature Granulocytes: 0.02 10*3/uL (ref 0.00–0.07)
Basophils Absolute: 0 10*3/uL (ref 0.0–0.1)
Basophils Relative: 1 %
Eosinophils Absolute: 0 10*3/uL (ref 0.0–0.5)
Eosinophils Relative: 0 %
HCT: 33.2 % — ABNORMAL LOW (ref 36.0–46.0)
Hemoglobin: 9.8 g/dL — ABNORMAL LOW (ref 12.0–15.0)
Immature Granulocytes: 0 %
Lymphocytes Relative: 8 %
Lymphs Abs: 0.5 10*3/uL — ABNORMAL LOW (ref 0.7–4.0)
MCH: 25.1 pg — ABNORMAL LOW (ref 26.0–34.0)
MCHC: 29.5 g/dL — ABNORMAL LOW (ref 30.0–36.0)
MCV: 85.1 fL (ref 80.0–100.0)
Monocytes Absolute: 0.5 10*3/uL (ref 0.1–1.0)
Monocytes Relative: 8 %
Neutro Abs: 5.2 10*3/uL (ref 1.7–7.7)
Neutrophils Relative %: 83 %
Platelets: 217 10*3/uL (ref 150–400)
RBC: 3.9 MIL/uL (ref 3.87–5.11)
RDW: 17.2 % — ABNORMAL HIGH (ref 11.5–15.5)
WBC: 6.2 10*3/uL (ref 4.0–10.5)
nRBC: 0 % (ref 0.0–0.2)

## 2019-08-24 LAB — COMPREHENSIVE METABOLIC PANEL
ALT: 19 U/L (ref 0–44)
AST: 46 U/L — ABNORMAL HIGH (ref 15–41)
Albumin: 3.2 g/dL — ABNORMAL LOW (ref 3.5–5.0)
Alkaline Phosphatase: 63 U/L (ref 38–126)
Anion gap: 13 (ref 5–15)
BUN: 41 mg/dL — ABNORMAL HIGH (ref 8–23)
CO2: 25 mmol/L (ref 22–32)
Calcium: 7.8 mg/dL — ABNORMAL LOW (ref 8.9–10.3)
Chloride: 103 mmol/L (ref 98–111)
Creatinine, Ser: 1.19 mg/dL — ABNORMAL HIGH (ref 0.44–1.00)
GFR calc Af Amer: 43 mL/min — ABNORMAL LOW (ref >60)
GFR calc non Af Amer: 37 mL/min — ABNORMAL LOW (ref >60)
Glucose, Bld: 109 mg/dL — ABNORMAL HIGH (ref 70–99)
Potassium: 3.8 mmol/L (ref 3.5–5.1)
Sodium: 141 mmol/L (ref 135–145)
Total Bilirubin: 0.4 mg/dL (ref 0.3–1.2)
Total Protein: 6.1 g/dL — ABNORMAL LOW (ref 6.5–8.1)

## 2019-08-24 LAB — URINALYSIS, ROUTINE W REFLEX MICROSCOPIC
Bilirubin Urine: NEGATIVE
Glucose, UA: NEGATIVE mg/dL
Hgb urine dipstick: NEGATIVE
Ketones, ur: NEGATIVE mg/dL
Leukocytes,Ua: NEGATIVE
Nitrite: NEGATIVE
Protein, ur: NEGATIVE mg/dL
Specific Gravity, Urine: 1.014 (ref 1.005–1.030)
pH: 5 (ref 5.0–8.0)

## 2019-08-24 LAB — BASIC METABOLIC PANEL
Anion gap: 14 (ref 5–15)
BUN: 43 mg/dL — ABNORMAL HIGH (ref 8–23)
CO2: 26 mmol/L (ref 22–32)
Calcium: 8.3 mg/dL — ABNORMAL LOW (ref 8.9–10.3)
Chloride: 101 mmol/L (ref 98–111)
Creatinine, Ser: 1.33 mg/dL — ABNORMAL HIGH (ref 0.44–1.00)
GFR calc Af Amer: 38 mL/min — ABNORMAL LOW (ref >60)
GFR calc non Af Amer: 33 mL/min — ABNORMAL LOW (ref >60)
Glucose, Bld: 112 mg/dL — ABNORMAL HIGH (ref 70–99)
Potassium: 3.9 mmol/L (ref 3.5–5.1)
Sodium: 141 mmol/L (ref 135–145)

## 2019-08-24 LAB — BLOOD GAS, ARTERIAL
Acid-Base Excess: 1.2 mmol/L (ref 0.0–2.0)
Bicarbonate: 24.5 mmol/L (ref 20.0–28.0)
FIO2: 36
O2 Saturation: 75 %
Patient temperature: 36.7
pCO2 arterial: 53.6 mmHg — ABNORMAL HIGH (ref 32.0–48.0)
pH, Arterial: 7.318 — ABNORMAL LOW (ref 7.350–7.450)
pO2, Arterial: 47.3 mmHg — ABNORMAL LOW (ref 83.0–108.0)

## 2019-08-24 LAB — POC SARS CORONAVIRUS 2 AG -  ED: SARS Coronavirus 2 Ag: POSITIVE — AB

## 2019-08-24 LAB — LACTATE DEHYDROGENASE: LDH: 181 U/L (ref 98–192)

## 2019-08-24 LAB — C-REACTIVE PROTEIN: CRP: 1.5 mg/dL — ABNORMAL HIGH (ref <1.0)

## 2019-08-24 LAB — FIBRINOGEN: Fibrinogen: 443 mg/dL (ref 210–475)

## 2019-08-24 LAB — PROCALCITONIN: Procalcitonin: 0.12 ng/mL

## 2019-08-24 LAB — D-DIMER, QUANTITATIVE: D-Dimer, Quant: 0.62 ug/mL-FEU — ABNORMAL HIGH (ref 0.00–0.50)

## 2019-08-24 LAB — ABO/RH: ABO/RH(D): A POS

## 2019-08-24 MED ORDER — ACETAMINOPHEN 325 MG PO TABS: 650.0000 mg | ORAL_TABLET | Freq: Four times a day (QID) | ORAL | Status: DC | PRN

## 2019-08-24 MED ORDER — ENOXAPARIN SODIUM 30 MG/0.3ML ~~LOC~~ SOLN
30.0000 mg | SUBCUTANEOUS | Status: DC
  Administered 2019-08-24: 30 mg via SUBCUTANEOUS
  Filled 2019-08-24: qty 0.3

## 2019-08-24 MED ORDER — ZINC SULFATE 220 (50 ZN) MG PO CAPS
220.0000 mg | ORAL_CAPSULE | Freq: Every day | ORAL | Status: DC
  Administered 2019-08-24: 220 mg via ORAL
  Filled 2019-08-24: qty 1

## 2019-08-24 MED ORDER — IPRATROPIUM-ALBUTEROL 20-100 MCG/ACT IN AERS
1.0000 | INHALATION_SPRAY | Freq: Four times a day (QID) | RESPIRATORY_TRACT | Status: DC | PRN
  Filled 2019-08-24: qty 4

## 2019-08-24 MED ORDER — ONDANSETRON HCL 4 MG/2ML IJ SOLN: 4.0000 mg | Freq: Four times a day (QID) | INTRAMUSCULAR | Status: DC | PRN

## 2019-08-24 MED ORDER — SODIUM CHLORIDE 0.9 % IV BOLUS (SEPSIS)
1000.0000 mL | Freq: Once | INTRAVENOUS | Status: AC
  Administered 2019-08-24: 500 mL via INTRAVENOUS

## 2019-08-24 MED ORDER — ALBUTEROL SULFATE HFA 108 (90 BASE) MCG/ACT IN AERS
4.0000 | INHALATION_SPRAY | RESPIRATORY_TRACT | Status: DC | PRN
  Administered 2019-08-24 – 2019-08-25 (×2): 4 via RESPIRATORY_TRACT
  Filled 2019-08-24: qty 6.7

## 2019-08-24 MED ORDER — ONDANSETRON HCL 4 MG PO TABS: 4.0000 mg | ORAL_TABLET | Freq: Four times a day (QID) | ORAL | Status: DC | PRN

## 2019-08-24 MED ORDER — GUAIFENESIN-DM 100-10 MG/5ML PO SYRP: 10.0000 mL | ORAL_SOLUTION | ORAL | Status: DC | PRN

## 2019-08-24 MED ORDER — MAGNESIUM HYDROXIDE 400 MG/5ML PO SUSP: 30.0000 mL | Freq: Two times a day (BID) | ORAL | Status: DC | PRN

## 2019-08-24 MED ORDER — LEVOTHYROXINE SODIUM 50 MCG PO TABS: 50.0000 ug | ORAL_TABLET | Freq: Every day | ORAL | Status: DC

## 2019-08-24 MED ORDER — VITAMIN C 500 MG PO TABS
500.0000 mg | ORAL_TABLET | Freq: Every day | ORAL | Status: DC
  Administered 2019-08-24: 500 mg via ORAL
  Filled 2019-08-24: qty 1

## 2019-08-24 MED ORDER — METHYLPREDNISOLONE SODIUM SUCC 40 MG IJ SOLR
0.5000 mg/kg | Freq: Two times a day (BID) | INTRAMUSCULAR | Status: DC
  Administered 2019-08-24 – 2019-08-25 (×2): 22.8 mg via INTRAVENOUS
  Filled 2019-08-24 (×2): qty 1

## 2019-08-24 MED ORDER — GABAPENTIN 300 MG PO CAPS
300.0000 mg | ORAL_CAPSULE | Freq: Two times a day (BID) | ORAL | Status: DC
  Administered 2019-08-24 (×2): 300 mg via ORAL
  Filled 2019-08-24: qty 3
  Filled 2019-08-24: qty 1

## 2019-08-24 MED ORDER — SODIUM CHLORIDE 0.9 % IV SOLN
INTRAVENOUS | Status: DC
  Administered 2019-08-24: 16:00:00 via INTRAVENOUS

## 2019-08-24 MED ORDER — SODIUM CHLORIDE 0.9 % IV SOLN
200.0000 mg | Freq: Once | INTRAVENOUS | Status: AC
  Administered 2019-08-24: 200 mg via INTRAVENOUS
  Filled 2019-08-24: qty 40

## 2019-08-24 MED ORDER — SODIUM CHLORIDE 0.9 % IV SOLN
100.0000 mg | Freq: Every day | INTRAVENOUS | Status: DC
  Administered 2019-08-25: 100 mg via INTRAVENOUS
  Filled 2019-08-24: qty 100
  Filled 2019-08-24: qty 20

## 2019-08-24 MED ORDER — PANTOPRAZOLE SODIUM 40 MG PO TBEC
40.0000 mg | DELAYED_RELEASE_TABLET | Freq: Every day | ORAL | Status: DC
  Administered 2019-08-24 – 2019-08-25 (×2): 40 mg via ORAL
  Filled 2019-08-24: qty 1

## 2019-08-24 MED ORDER — SODIUM CHLORIDE 0.9 % IV BOLUS
1000.0000 mL | Freq: Once | INTRAVENOUS | Status: AC
  Administered 2019-08-24: 1000 mL via INTRAVENOUS

## 2019-08-24 NOTE — ED Triage Notes (Signed)
Patient arrived via [facility] from [facility], due to weakness, room air 85%, EMS give 2LPM, patient up to 95%.  Patient not normally on 02 at home.  BP 90/50  528ml NS bolus no changes.  IV right AC 20 G.

## 2019-08-24 NOTE — ED Notes (Signed)
Spoke with nurse at [facility] who states pt was supposed to be on oxygen but when pt was discharged from hospice a few months ago the order was taken out and never renewed. Pt has not been complaining of anything, but when they checked her vitals today noted her bp to be low as well as o2 sat.

## 2019-08-24 NOTE — H&P (Signed)
History and Physical    Jaclyn Simpson [id number] DOB: December 12, 1916 DOA: 09/07/2019  Referring MD/NP/PA: Dr. Roderic Palau PCP: System, Pcp Not In  Patient coming from: SNF  Chief Complaint: SOB, general malaise and hypoxia.  HPI: Jaclyn Simpson is a 85 y.o. female with PMH significant for HTN, prior hx of stroke, HLD, CKD stage 3b, GERD and hypothyroidism; who came to ED from her SNF secondary to increase fatigue, general malaise and SOB. Patient found with mild hypoxia (O2 sat's in mid 80's at RA. Patient reported anorexia and general malaise. Denies CP, nausea, vomiting, dysuria, hematuria, melena, hematochezia, focal weakness or any other complaints. In ED found to be mildly dehydrated, with decrease skin turgor and dry MM. Blood work demonstrated acute on Chronic renal failure and positive COVID-19. CXR with bibasilar atelectasis.  IVF's initiated, O2 supplementation In place (3-4L [place]), inflammatory markers ordered, steroids and remdesivir given and [facility] called to admit patient for further evaluation and management.  Past Medical/Surgical History: Past Medical History:  Diagnosis Date  . Anemia, iron deficiency    normal CBC in 2012  . Aortic valve stenosis   . Cavernous angioma    Pons/cerebellum  . Cerebrovascular accident [facility] 1975   Right brainstem  . Colles' fracture    right wrist  . Cyst of skin    right inner thigh  . Diverticulosis   . Humerus fracture    proximal  . Hyperlipidemia   . Osteoporosis   . Polyneuropathy   . Spinal stenosis   . Syncope    Echo in 12/2010-mild to moderate LVH; normal EF; mild AS and AI  . Thyroid disease     Past Surgical History:  Procedure Laterality Date  . APPENDECTOMY  1940s  . BREAST SURGERY    . CATARACT EXTRACTION     Bilateral  . COLONOSCOPY  2001  . LUMBAR SPINE SURGERY    . THYROIDECTOMY  1971   Goiter  . TOTAL ABDOMINAL HYSTERECTOMY  1940s    Social History:  reports that she has never smoked. She has never  used smokeless tobacco. She reports that she does not drink alcohol or use drugs.  Allergies: No Known Allergies  Family History:  Family History  Problem Relation Age of Onset  . Stroke Mother   . Heart disease Father   . Pneumonia Brother        x2 brothers  . Heart attack Brother   . Cancer Sister        brain tumor    Prior to Admission medications   Medication Sig Start Date End Date Taking? Authorizing Provider  acetaminophen (TYLENOL) 325 MG tablet Take 650 mg by mouth 2 (two) times daily.    [provider]  alendronate (FOSAMAX) 70 MG tablet Take 70 mg by mouth every 7 (seven) days. Take with a full glass of water on an empty stomach. Take on Mondays    [provider]  Calcium Carbonate-Vitamin D (CALCIUM 600+D) 600-400 MG-UNIT tablet Take 1 tablet by mouth daily.    [provider]  clotrimazole-betamethasone (LOTRISONE) cream Apply 1 application topically 2 (two) times daily.    [provider]  cyanocobalamin (,VITAMIN B-12,) 1000 MCG/ML injection Inject 1,000 mcg into the muscle every 30 (thirty) days.     [provider]  ferrous sulfate 325 (65 FE) MG tablet Take 325 mg by mouth daily with breakfast.    [provider]  furosemide (LASIX) 20 MG tablet Take 20 mg by  mouth daily.    [provider]  gabapentin (NEURONTIN) 300 MG capsule Take 300 mg by mouth 2 (two) times daily.      [provider]  levothyroxine (SYNTHROID, LEVOTHROID) 50 MCG tablet Take 50 mcg by mouth daily before breakfast.    [provider]  magnesium hydroxide (MILK OF MAGNESIA) 400 MG/5ML suspension Take 30 mLs by mouth 2 (two) times daily as needed for mild constipation.    [provider]  Multiple Vitamins-Minerals (ICAPS AREDS 2 PO) Take 1 tablet by mouth daily.    [provider]  mupirocin ointment (BACTROBAN) 2 % Apply 1 application topically 3 (three) times daily as needed.    [provider]  omeprazole (PRILOSEC) 20 MG capsule Take 20 mg by mouth daily.      [provider]  polycarbophil (FIBER-LAX) 625 MG tablet Take 1,250 mg by mouth daily.    [provider]  polyethylene glycol (MIRALAX / GLYCOLAX) packet Take 17 g by mouth daily.    [provider]  potassium chloride (K-DUR,KLOR-CON) 10 MEQ tablet Take 10 mEq by mouth daily.    [provider]  senna-docusate (SENOKOT-S) 8.6-50 MG tablet Take 1 tablet by mouth daily.    [provider]  simvastatin (ZOCOR) 20 MG tablet Take 1 tablet by mouth at bedtime. 09/23/15   [provider]    Review of Systems:  Positive ROS for hard of hearing (not new), LE edema (1+, chronic) and weakness; ROS otherwise negative except as mentioned on HPI.   Physical Exam: Vitals:   09/03/2019 1341 08/15/2019 1342 08/14/2019 1400 08/23/2019 1413  BP:   (!) 83/57 92/61  Pulse: 72 71    Resp:      Temp:      TempSrc:      SpO2: 94% 94%    Weight:      Height:        Constitutional: Pleasantly confused, dry mucous membranes on examination,  expressed feeling short of breath.  No chest pain, no nausea, no vomiting.   Eyes: PERRL, lids and conjunctivae normal; no icterus. ENMT: Mucous membranes are dry. Posterior pharynx clear of any exudate or lesions. Neck: normal, supple, no masses, no thyromegaly Respiratory: no wheezing, no crackles; positive rhonchi and decrease air movement. Positive tachypnea, no using accessory muscles. Cardiovascular: Regular rate and rhythm, soft SEM, no rubs, no gallops. Positive 1+ edema bilaterally. Abdomen: no tenderness, no masses palpated. No hepatosplenomegaly. Bowel sounds positive.  Musculoskeletal: no clubbing / cyanosis. No joint deformity upper and lower extremities. Good ROM, no contractures. Normal muscle tone.  Skin: no rashes, no petechiae, ted hoses in place. Neurologic: CN 2-12 grossly intact. Sensation intact, DTR normal. Strength  3-4/5 in all 4 limbs due to poor effort.Marland Kitchen  Psychiatric: Normal judgment and insight. Alert and oriented x 3. Normal mood.    Labs on Admission: I have personally reviewed the following labs and imaging studies  CBC: Recent Labs  Lab 09/09/2019 1253  WBC 6.2  NEUTROABS 5.2  HGB 9.8*  HCT 33.2*  MCV 85.1  PLT [id number]   Basic Metabolic Panel: Recent Labs  Lab 08/18/2019 1253  NA 141  K 3.9  CL 101  CO2 26  GLUCOSE 112*  BUN 43*  CREATININE 1.33*  CALCIUM 8.3*   GFR: Estimated Creatinine Clearance: 16.1 mL/min (A) (by C-G formula based on SCr of 1.33 mg/dL (H)).  Urine analysis:    Component Value Date/Time   COLORURINE YELLOW 11/07/2018  2214   APPEARANCEUR CLEAR 11/07/2018 2214   LABSPEC 1.016 11/07/2018 2214   PHURINE 5.0 11/07/2018 2214   GLUCOSEU NEGATIVE 11/07/2018 2214   Brown 11/07/2018 2214   BILIRUBINUR NEGATIVE 11/07/2018 2214   Green 11/07/2018 2214   PROTEINUR NEGATIVE 11/07/2018 2214   NITRITE NEGATIVE 11/07/2018 2214   LEUKOCYTESUR NEGATIVE 11/07/2018 2214   Sepsis Labs: @LABRCNTIP (procalcitonin:4,lacticidven:4) )No results found for this or any previous visit (from the past 240 hour(s)).   Radiological Exams on Admission: DG Chest 1 View  Result Date: 08/29/2019 CLINICAL DATA:  Shortness of breath. EXAM: CHEST  1 VIEW COMPARISON:  November 07, 2018. FINDINGS: Stable cardiomegaly. Atherosclerosis of thoracic aorta is noted. No pneumothorax is noted. Mild bibasilar atelectasis is noted with probable small pleural effusions. Bony thorax is unremarkable. IMPRESSION: 1. Aortic atherosclerosis. Mild bibasilar subsegmental atelectasis with probable small pleural effusions. 2. No pneumothorax is noted. Electronically Signed   By: Marijo Conception M.D.   On: 08/28/2019 13:35    EKG: none   Assessment/Plan 1-acute respiratory disease due to COVID-19 virus -Patient came from skilled nursing facility, complaining of shortness of breath and  found to be hypoxic. -She also complains of chills and anorexia. -On presentation to be hypoxic requiring 3-4 L nasal cannula supplementation and also mildly hypertensive with acute on chronic renal failure. -Chest x-ray demonstrating bilateral atelectatic changes. -IV fluids initiated -patient will be admitted for his steroids and remdesivir -Continue oxygen supplementation -As needed antitussive and as needed antiemetics -Patient started on vitamin C and zinc. -PO2 on ABG 47.3  2-acute on chronic renal failure -Stage IIIb at baseline as per GFR evaluation. -Worsening presentation in the setting of prerenal azotemia with hypotension and decreased oral intake. -Continue IV fluids -Discontinue, avoid and minimize nephrotoxic agents. -Checking urinalysis -Follow renal function trend.  3-hypothyroidism -Continue Synthroid.  4-gastroesophageal reflux disease -Continue PPI  5-chronic history of constipation -Continue milk of magnesia as needed to help with bowel movement.  6-essential hypertension -Lasix has been discontinue in the setting of hypotension and acute renal failure. -Follow vital signs. -Continue IV fluids.  7-DNR/DNI -Patient with prior to admission wishes decision to be a DO NOT RESUSCITATE -Which will be granted -Continue to treat the treatable and follow clinical response.  8-hyperlipidemia -holding statins initially  -check LFT's  9-mild dementia -pleasantly confused -continue supportive care -continue constant re-orientation.   DVT prophylaxis: Lovenox Code Status: DNR/DNI Family Communication: No family at bedside. Disposition Plan: Discharge back to skilled nursing facility once medically stable. Consults called: None Admission status: inpatient, LOS > 2 midnights,    Time Spent: 70 minutes  Barton Dubois MD [facility] Pager [phone]   08/28/2019, 2:58 PM

## 2019-08-24 NOTE — Progress Notes (Addendum)
Called by RN that patient's blood pressure still soft even after getting 500 cc normal saline bolus. I called patient's niece [name], discussed patient's condition.  Patient is a DNR.  Discussed pressor support if patient becomes hypotensive.  At this time patient's niece does not want aggressive intervention including pressor support.  We will continue supportive care with IV fluids for hypotension and other management as per Dr. Anson Fret plan.  Patient will be admitted to telemetry floor.

## 2019-08-24 NOTE — ED Provider Notes (Signed)
[facility] EMERGENCY DEPARTMENT Provider Note   CSN: [id number] Arrival date & time: 08/25/2019  1154     History Chief Complaint  Patient presents with  . Weakness    Jaclyn Simpson is a 85 y.o. female.  Patient sent to the emergency department with shortness of breath low oxygen.  She lives at a nursing home.  The history is provided by the patient and the nursing home.  Weakness Severity:  Mild Onset quality:  Sudden Timing:  Constant Progression:  Waxing and waning Chronicity:  New Context: not alcohol use   Relieved by:  Nothing Worsened by:  Nothing Ineffective treatments:  None tried Associated symptoms: shortness of breath   Shortness of Breath      Past Medical History:  Diagnosis Date  . Anemia, iron deficiency    normal CBC in 2012  . Aortic valve stenosis   . Cavernous angioma    Pons/cerebellum  . Cerebrovascular accident [facility]) 1975   Right brainstem  . Colles' fracture    right wrist  . Cyst of skin    right inner thigh  . Diverticulosis   . Humerus fracture    proximal  . Hyperlipidemia   . Osteoporosis   . Polyneuropathy   . Spinal stenosis   . Syncope    Echo in 12/2010-mild to moderate LVH; normal EF; mild AS and AI  . Thyroid disease     Patient Active Problem List   Diagnosis Date Noted  . Generalized weakness 11/07/2018  . Dehydration 10/19/2015  . Hypokalemia 10/19/2015  . Hypochloremic alkalosis 10/19/2015  . DNR (do not resuscitate) 10/19/2015  . Acute urinary retention   . Altered mental status   . Hypermagnesemia   . Hyponatremia   . Pelvic mass in female 12/22/2013  . 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 Metatarsal fracture 01/01/2013  . Chronic kidney disease 08/01/2011  . Hyperlipidemia 08/01/2011  . Gastroesophageal reflux disease 08/01/2011  . Osteoporosis 08/01/2011  . Hypothyroid 08/01/2011  . Laboratory test 08/01/2011  . Spinal stenosis   . Edema extremities 07/21/2011  . Syncope     . Aortic valve stenosis   . Cerebrovascular accident [facility]) 09/30/2009    Past Surgical History:  Procedure Laterality Date  . APPENDECTOMY  1940s  . BREAST SURGERY    . CATARACT EXTRACTION     Bilateral  . COLONOSCOPY  2001  . LUMBAR SPINE SURGERY    . THYROIDECTOMY  1971   Goiter  . TOTAL ABDOMINAL HYSTERECTOMY  1940s     OB History   No obstetric history on file.     Family History  Problem Relation Age of Onset  . Stroke Mother   . Heart disease Father   . Pneumonia Brother        x2 brothers  . Heart attack Brother   . Cancer Sister        brain tumor    Social History   Tobacco Use  . Smoking status: Never Smoker  . Smokeless tobacco: Never Used  Substance Use Topics  . Alcohol use: No  . Drug use: No    Home Medications Prior to Admission medications   Medication Sig Start Date End Date Taking? Authorizing Provider  acetaminophen (TYLENOL) 325 MG tablet Take 650 mg by mouth 2 (two) times daily.    [provider]  alendronate (FOSAMAX) 70 MG tablet Take 70 mg by mouth every 7 (seven) days. Take with a full glass of water  on an empty stomach. Take on Mondays    [provider]  Calcium Carbonate-Vitamin D (CALCIUM 600+D) 600-400 MG-UNIT tablet Take 1 tablet by mouth daily.    [provider]  clotrimazole-betamethasone (LOTRISONE) cream Apply 1 application topically 2 (two) times daily.    [provider]  cyanocobalamin (,VITAMIN B-12,) 1000 MCG/ML injection Inject 1,000 mcg into the muscle every 30 (thirty) days.     [provider]  ferrous sulfate 325 (65 FE) MG tablet Take 325 mg by mouth daily with breakfast.    [provider]  furosemide (LASIX) 20 MG tablet Take 20 mg by mouth daily.    [provider]  gabapentin (NEURONTIN) 300 MG capsule Take 300 mg by mouth 2 (two) times daily.      [provider]  levothyroxine (SYNTHROID, LEVOTHROID) 50 MCG tablet Take 50 mcg by mouth  daily before breakfast.    [provider]  magnesium hydroxide (MILK OF MAGNESIA) 400 MG/5ML suspension Take 30 mLs by mouth 2 (two) times daily as needed for mild constipation.    [provider]  Multiple Vitamins-Minerals (ICAPS AREDS 2 PO) Take 1 tablet by mouth daily.    [provider]  mupirocin ointment (BACTROBAN) 2 % Apply 1 application topically 3 (three) times daily as needed.    [provider]  omeprazole (PRILOSEC) 20 MG capsule Take 20 mg by mouth daily.      [provider]  polycarbophil (FIBER-LAX) 625 MG tablet Take 1,250 mg by mouth daily.    [provider]  polyethylene glycol (MIRALAX / GLYCOLAX) packet Take 17 g by mouth daily.    [provider]  potassium chloride (K-DUR,KLOR-CON) 10 MEQ tablet Take 10 mEq by mouth daily.    [provider]  senna-docusate (SENOKOT-S) 8.6-50 MG tablet Take 1 tablet by mouth daily.    [provider]  simvastatin (ZOCOR) 20 MG tablet Take 1 tablet by mouth at bedtime. 09/23/15   [provider]    Allergies    Patient has no known allergies.  Review of Systems   Review of Systems  Unable to perform ROS: Dementia  Respiratory: Positive for shortness of breath.   Neurological: Positive for weakness.    Physical Exam Updated Vital Signs BP 92/61   Pulse 71   Temp 98.1 F (36.7 C) (Oral)   Resp 16   Ht 5\' 2"  (1.575 m)   Wt 45.4 kg   SpO2 94%   BMI 18.29 kg/m   Physical Exam Vitals and nursing note reviewed.  Constitutional:      Appearance: She is well-developed.  HENT:     Head: Normocephalic.     Nose: Nose normal.  Eyes:     General: No scleral icterus.    Conjunctiva/sclera: Conjunctivae normal.  Neck:     Thyroid: No thyromegaly.  Cardiovascular:     Rate and Rhythm: Normal rate and regular rhythm.     Heart sounds: No murmur. No friction rub. No gallop.   Pulmonary:     Breath sounds: No stridor. No wheezing or  rales.  Chest:     Chest wall: No tenderness.  Abdominal:     General: There is no distension.     Tenderness: There is no abdominal tenderness. There is no rebound.  Musculoskeletal:        General: Normal range of motion.     Cervical back: Neck supple.  Lymphadenopathy:     Cervical: No cervical  adenopathy.  Skin:    Findings: No erythema or rash.  Neurological:     Mental Status: She is alert and oriented to person, place, and time.     Motor: No abnormal muscle tone.     Coordination: Coordination normal.  Psychiatric:        Behavior: Behavior normal.     ED Results / Procedures / Treatments   Labs (all labs ordered are listed, but only abnormal results are displayed) Labs Reviewed  CBC WITH DIFFERENTIAL/PLATELET - Abnormal; Notable for the following components:      Result Value   Hemoglobin 9.8 (*)    HCT 33.2 (*)    MCH 25.1 (*)    MCHC 29.5 (*)    RDW 17.2 (*)    Lymphs Abs 0.5 (*)    All other components within normal limits  BASIC METABOLIC PANEL - Abnormal; Notable for the following components:   Glucose, Bld 112 (*)    BUN 43 (*)    Creatinine, Ser 1.33 (*)    Calcium 8.3 (*)    GFR calc non Af Amer 33 (*)    GFR calc Af Amer 38 (*)    All other components within normal limits  POC SARS CORONAVIRUS 2 AG -  ED - Abnormal; Notable for the following components:   SARS Coronavirus 2 Ag POSITIVE (*)    All other components within normal limits  URINALYSIS, ROUTINE W REFLEX MICROSCOPIC    EKG None  Radiology DG Chest 1 View  Result Date: 08/17/2019 CLINICAL DATA:  Shortness of breath. EXAM: CHEST  1 VIEW COMPARISON:  November 07, 2018. FINDINGS: Stable cardiomegaly. Atherosclerosis of thoracic aorta is noted. No pneumothorax is noted. Mild bibasilar atelectasis is noted with probable small pleural effusions. Bony thorax is unremarkable. IMPRESSION: 1. Aortic atherosclerosis. Mild bibasilar subsegmental atelectasis with probable small pleural effusions.  2. No pneumothorax is noted. Electronically Signed   By: Marijo Conception M.D.   On: 09/09/2019 13:35    Procedures Procedures (including critical care time)  Medications Ordered in ED Medications  sodium chloride 0.9 % bolus 1,000 mL (1,000 mLs Intravenous New Bag/Given 08/13/2019 1329)    ED Course  I have reviewed the triage vital signs and the nursing notes.  Pertinent labs & imaging results that were available during my care of the patient were reviewed by me and considered in my medical decision making (see chart for details).    MDM Rules/Calculators/A&P     CHA2DS2/VAS Stroke Risk Points      N/A >= 2 Points: High Risk  1 - 1.99 Points: Medium Risk  0 Points: Low Risk    A final score could not be computed because of missing components.: Last  Change: N/A     This score determines the patient's risk of having a stroke if the  patient has atrial fibrillation.      This score is not applicable to this patient. Components are not  calculated.        Jaclyn Simpson was evaluated in Emergency Department on 08/13/2019 for the symptoms described in the history of present illness. She was evaluated in the context of the global COVID-19 pandemic, which necessitated consideration that the patient might be at risk for infection with the SARS-CoV-2 virus that causes COVID-19. Institutional protocols and algorithms that pertain to the evaluation of patients at risk for COVID-19 are in a state of rapid change based on information released by regulatory bodies including the CDC  and federal and state organizations. These policies and algorithms were followed during the patient's care in the ED.             Patient will be admitted for bed.  She is mildly hypoxic and hypotensive most likely secondary to dehydration Final Clinical Impression(s) / ED Diagnoses Final diagnoses:  COVID-19    Rx / DC Orders ED Discharge Orders    None       Milton Ferguson, MD 09/05/2019 1454

## 2019-08-24 NOTE — ED Notes (Signed)
Patient give 500 NS bolus per Dr. Darrick Meigs.

## 2019-08-25 ENCOUNTER — Encounter (HOSPITAL_COMMUNITY): Payer: Self-pay | Admitting: Internal Medicine

## 2019-08-25 ENCOUNTER — Other Ambulatory Visit: Payer: Self-pay

## 2019-08-25 DIAGNOSIS — E039 Hypothyroidism, unspecified: Secondary | ICD-10-CM

## 2019-08-25 DIAGNOSIS — I10 Essential (primary) hypertension: Secondary | ICD-10-CM

## 2019-08-25 DIAGNOSIS — Z515 Encounter for palliative care: Secondary | ICD-10-CM

## 2019-08-25 DIAGNOSIS — Z7189 Other specified counseling: Secondary | ICD-10-CM

## 2019-08-25 DIAGNOSIS — U071 COVID-19: Principal | ICD-10-CM

## 2019-08-25 DIAGNOSIS — J9600 Acute respiratory failure, unspecified whether with hypoxia or hypercapnia: Secondary | ICD-10-CM

## 2019-08-25 DIAGNOSIS — Z66 Do not resuscitate: Secondary | ICD-10-CM

## 2019-08-25 LAB — MAGNESIUM: Magnesium: 2.4 mg/dL (ref 1.7–2.4)

## 2019-08-25 LAB — CBC WITH DIFFERENTIAL/PLATELET
Abs Immature Granulocytes: 0.07 10*3/uL (ref 0.00–0.07)
Basophils Absolute: 0 10*3/uL (ref 0.0–0.1)
Basophils Relative: 0 %
Eosinophils Absolute: 0.1 10*3/uL (ref 0.0–0.5)
Eosinophils Relative: 1 %
HCT: 43.1 % (ref 36.0–46.0)
Hemoglobin: 11.9 g/dL — ABNORMAL LOW (ref 12.0–15.0)
Immature Granulocytes: 1 %
Lymphocytes Relative: 7 %
Lymphs Abs: 0.8 10*3/uL (ref 0.7–4.0)
MCH: 24.9 pg — ABNORMAL LOW (ref 26.0–34.0)
MCHC: 27.6 g/dL — ABNORMAL LOW (ref 30.0–36.0)
MCV: 90.2 fL (ref 80.0–100.0)
Monocytes Absolute: 1.1 10*3/uL — ABNORMAL HIGH (ref 0.1–1.0)
Monocytes Relative: 10 %
Neutro Abs: 9.1 10*3/uL — ABNORMAL HIGH (ref 1.7–7.7)
Neutrophils Relative %: 81 %
Platelets: 275 10*3/uL (ref 150–400)
RBC: 4.78 MIL/uL (ref 3.87–5.11)
RDW: 17.5 % — ABNORMAL HIGH (ref 11.5–15.5)
WBC: 11.3 10*3/uL — ABNORMAL HIGH (ref 4.0–10.5)
nRBC: 0 % (ref 0.0–0.2)

## 2019-08-25 LAB — COMPREHENSIVE METABOLIC PANEL
ALT: 26 U/L (ref 0–44)
AST: 66 U/L — ABNORMAL HIGH (ref 15–41)
Albumin: 3.3 g/dL — ABNORMAL LOW (ref 3.5–5.0)
Alkaline Phosphatase: 68 U/L (ref 38–126)
Anion gap: 15 (ref 5–15)
BUN: 43 mg/dL — ABNORMAL HIGH (ref 8–23)
CO2: 23 mmol/L (ref 22–32)
Calcium: 8.3 mg/dL — ABNORMAL LOW (ref 8.9–10.3)
Chloride: 105 mmol/L (ref 98–111)
Creatinine, Ser: 1.11 mg/dL — ABNORMAL HIGH (ref 0.44–1.00)
GFR calc Af Amer: 47 mL/min — ABNORMAL LOW (ref >60)
GFR calc non Af Amer: 41 mL/min — ABNORMAL LOW (ref >60)
Glucose, Bld: 141 mg/dL — ABNORMAL HIGH (ref 70–99)
Potassium: 4.5 mmol/L (ref 3.5–5.1)
Sodium: 143 mmol/L (ref 135–145)
Total Bilirubin: 0.5 mg/dL (ref 0.3–1.2)
Total Protein: 6.5 g/dL (ref 6.5–8.1)

## 2019-08-25 LAB — PHOSPHORUS: Phosphorus: 6.4 mg/dL — ABNORMAL HIGH (ref 2.5–4.6)

## 2019-08-25 LAB — FERRITIN: Ferritin: 23 ng/mL (ref 11–307)

## 2019-08-25 LAB — D-DIMER, QUANTITATIVE: D-Dimer, Quant: 1.15 ug/mL-FEU — ABNORMAL HIGH (ref 0.00–0.50)

## 2019-08-25 LAB — C-REACTIVE PROTEIN: CRP: 3.6 mg/dL — ABNORMAL HIGH (ref <1.0)

## 2019-08-25 MED ORDER — MORPHINE SULFATE (PF) 2 MG/ML IV SOLN
2.0000 mg | INTRAVENOUS | Status: DC | PRN
  Administered 2019-08-25 (×2): 2 mg via INTRAVENOUS
  Filled 2019-08-25 (×2): qty 1

## 2019-08-25 MED ORDER — LORAZEPAM 2 MG/ML PO CONC: 1.0000 mg | ORAL | Status: DC | PRN

## 2019-08-25 MED ORDER — GLYCOPYRROLATE 0.2 MG/ML IJ SOLN: 0.2000 mg | INTRAMUSCULAR | Status: DC | PRN

## 2019-08-25 MED ORDER — MORPHINE SULFATE (PF) 2 MG/ML IV SOLN
2.0000 mg | INTRAVENOUS | Status: DC
  Administered 2019-08-25 (×2): 2 mg via INTRAVENOUS
  Filled 2019-08-25 (×3): qty 1

## 2019-08-25 MED ORDER — MORPHINE SULFATE (PF) 2 MG/ML IV SOLN: 1.0000 mg | Freq: Once | INTRAVENOUS | Status: DC

## 2019-08-25 MED ORDER — LORAZEPAM 1 MG PO TABS: 1.0000 mg | ORAL_TABLET | ORAL | Status: DC | PRN

## 2019-08-25 MED ORDER — SCOPOLAMINE 1 MG/3DAYS TD PT72
1.0000 | MEDICATED_PATCH | TRANSDERMAL | Status: DC
  Administered 2019-08-25: 1.5 mg via TRANSDERMAL
  Filled 2019-08-25: qty 1

## 2019-08-25 MED ORDER — HALOPERIDOL 0.5 MG PO TABS: 0.5000 mg | ORAL_TABLET | ORAL | Status: DC | PRN

## 2019-08-25 MED ORDER — HALOPERIDOL LACTATE 2 MG/ML PO CONC: 0.5000 mg | ORAL | Status: DC | PRN

## 2019-08-25 MED ORDER — POLYVINYL ALCOHOL 1.4 % OP SOLN: 1.0000 [drp] | Freq: Four times a day (QID) | OPHTHALMIC | Status: DC | PRN

## 2019-08-25 MED ORDER — LORAZEPAM 2 MG/ML IJ SOLN: 1.0000 mg | INTRAMUSCULAR | Status: DC | PRN

## 2019-08-25 MED ORDER — MORPHINE SULFATE (PF) 2 MG/ML IV SOLN
INTRAVENOUS | Status: AC
  Administered 2019-08-25: 1 mg via INTRAVENOUS
  Filled 2019-08-25: qty 1

## 2019-08-25 MED ORDER — BIOTENE DRY MOUTH MT LIQD: 15.0000 mL | OROMUCOSAL | Status: DC | PRN

## 2019-08-25 MED ORDER — FUROSEMIDE 10 MG/ML IJ SOLN
20.0000 mg | Freq: Once | INTRAMUSCULAR | Status: AC
  Administered 2019-08-25: 20 mg via INTRAVENOUS
  Filled 2019-08-25: qty 2

## 2019-08-25 MED ORDER — MORPHINE SULFATE (PF) 2 MG/ML IV SOLN: 2.0000 mg | INTRAVENOUS | Status: DC | PRN

## 2019-08-25 MED ORDER — HALOPERIDOL LACTATE 5 MG/ML IJ SOLN: 0.5000 mg | INTRAMUSCULAR | Status: DC | PRN

## 2019-08-25 MED ORDER — GLYCOPYRROLATE 1 MG PO TABS: 1.0000 mg | ORAL_TABLET | ORAL | Status: DC | PRN

## 2019-08-25 NOTE — Consult Note (Signed)
Consultation Note Date: 08/25/2019   Patient Name: Jaclyn Simpson  DOB: [birth year]  MRN: [id number]  Age / Sex: 85 y.o., female  PCP: System, Pcp Not In Referring Physician: Barton Dubois, MD  Reason for Consultation: Establishing goals of care and Terminal Care  HPI/Patient Profile: 85 y.o. female  with past medical history of HTN, stroke, HLD, CKD 3b, hypothyroidism admitted on 08/17/2019 from SNF with fatigue, malaise, SOB, anorexia. Workup revealed respiratory failure in the setting of COVID-19. She was started on remdesivir and steroids with limits set at DNR, no Bipap, no pressors. This morning she had worsening change in mental status and respiratory status- Palliative medicine consulted for [place].   Clinical Assessment and Goals of Care: I reviewed patient's chart including labs, imaging, exam. Received report from Dr. Dyann Kief and patient's RN- Lilia Pro.  On my exam patient appeared to be actively dying- nonresponsive, extremities cool, breathing somewhat agonal on NRB mask.  I called and discussed her status with her [place].  Benjamine Mola tells me [name] is one of ten children- many who have lived to be 61.  Per her discussion with Dr. Glendon Axe is aware that patient is dying.  I discussed the roles of ongoing aggressive medical care - ie IV fluids, labs, medications vs comfort care in actively dying patients.  Benjamine Mola agreed to transition to full comfort care- with d/c of fluids, medications, labs.  I discussed plan of care with patient's nurse.   Primary Decision Maker HCPOA- Elizabeth Holleman    SUMMARY OF RECOMMENDATIONS -Will schedule morphine IV 2mg  q4hr for comfort -Morphine 2mg  q1hr prn IV for comfort  -Lorazepam 1mg  q4hr prn agitation, anxiety, SOB unrelieved with morphine -Robinul .77mcg IV q4hr prn for secretions -Recommend transition to nasal canula and give  morphine for SOB or air hunger- do not check pulse ox, do not titrate O2 up  Code Status/Advance Care Planning:  DNR  Palliative Prophylaxis:   Frequent Pain Assessment  Prognosis:    Hours - Days  Discharge Planning: Anticipated Hospital Death  Primary Diagnoses: Present on Admission: . Acute respiratory disease due to COVID-19 virus . Acute respiratory failure due to COVID-19 ([place]) . Acute respiratory failure ([place])   I have reviewed the medical record, interviewed the patient and family, and examined the patient. The following aspects are pertinent.  Past Medical History:  Diagnosis Date  . Anemia, iron deficiency    normal CBC in 2012  . Aortic valve stenosis   . Cavernous angioma    Pons/cerebellum  . Cerebrovascular accident [facility]) 1975   Right brainstem  . Colles' fracture    right wrist  . Cyst of skin    right inner thigh  . Diverticulosis   . Humerus fracture    proximal  . Hyperlipidemia   . Osteoporosis   . Polyneuropathy   . Spinal stenosis   . Syncope    Echo in 12/2010-mild to moderate LVH; normal EF; mild AS and AI  . Thyroid disease    Social History  Socioeconomic History  . Marital status: Widowed    Spouse name: Not on file  . Number of children: 0  . Years of education: Not on file  . Highest education level: Not on file  Occupational History  . Occupation: retired  Tobacco Use  . Smoking status: Never Smoker  . Smokeless tobacco: Never Used  Substance and Sexual Activity  . Alcohol use: No  . Drug use: No  . Sexual activity: Not Currently    Birth control/protection: Surgical  Other Topics Concern  . Not on file  Social History Narrative  . Not on file   Social Determinants of Health   Financial Resource Strain: Unknown  . Difficulty of Paying Living Expenses: Patient refused  Food Insecurity: Unknown  . Worried About Charity fundraiser in the Last Year: Patient refused  . Ran Out of Food in the Last Year: Patient  refused  Transportation Needs: Unknown  . Lack of Transportation (Medical): Patient refused  . Lack of Transportation (Non-Medical): Patient refused  Physical Activity: Unknown  . Days of Exercise per Week: Patient refused  . Minutes of Exercise per Session: Patient refused  Stress: Unknown  . Feeling of Stress : Patient refused  Social Connections: Unknown  . Frequency of Communication with Friends and Family: Patient refused  . Frequency of Social Gatherings with Friends and Family: Patient refused  . Attends Religious Services: Patient refused  . Active Member of Clubs or Organizations: Patient refused  . Attends Archivist Meetings: Patient refused  . Marital Status: Patient refused   Family History  Problem Relation Age of Onset  . Stroke Mother   . Heart disease Father   . Pneumonia Brother        x2 brothers  . Heart attack Brother   . Cancer Sister        brain tumor   Scheduled Meds: .  morphine injection  1 mg Intravenous Once  .  morphine injection  2 mg Intravenous Q4H  . scopolamine  1 patch Transdermal Q72H   Continuous Infusions: PRN Meds:.acetaminophen, antiseptic oral rinse, glycopyrrolate **OR** glycopyrrolate **OR** glycopyrrolate, haloperidol **OR** haloperidol **OR** haloperidol lactate, LORazepam **OR** LORazepam **OR** LORazepam, morphine injection, ondansetron **OR** ondansetron (ZOFRAN) IV, polyvinyl alcohol Medications Prior to Admission:  Prior to Admission medications   Medication Sig Start Date End Date Taking? Authorizing Provider  acetaminophen (TYLENOL) 325 MG tablet Take 650 mg by mouth 2 (two) times daily.   Yes [provider]  Calcium Carbonate-Vitamin D (CALCIUM 600+D) 600-400 MG-UNIT tablet Take 1 tablet by mouth daily.   Yes [provider]  furosemide (LASIX) 20 MG tablet Take 20 mg by mouth daily.   Yes [provider]  gabapentin (NEURONTIN) 300 MG capsule Take 300 mg by mouth 2 (two) times daily.      Yes [provider]  levothyroxine (SYNTHROID, LEVOTHROID) 50 MCG tablet Take 50 mcg by mouth daily before breakfast.   Yes [provider]  Multiple Vitamins-Minerals (ICAPS AREDS 2 PO) Take 1 tablet by mouth daily.   Yes [provider]  omeprazole (PRILOSEC) 20 MG capsule Take 20 mg by mouth daily.     Yes [provider]  polyethylene glycol (MIRALAX / GLYCOLAX) packet Take 17 g by mouth daily.   Yes [provider]  potassium chloride (K-DUR,KLOR-CON) 10 MEQ tablet Take 10 mEq by mouth daily.   Yes [provider]  potassium chloride (KLOR-CON) 10 MEQ tablet Take 10 mEq by mouth  daily. 08/22/19  Yes [provider]  senna-docusate (SENOKOT-S) 8.6-50 MG tablet Take 1 tablet by mouth daily.   Yes [provider]  simvastatin (ZOCOR) 20 MG tablet Take 1 tablet by mouth at bedtime. 09/23/15  Yes [provider]  alendronate (FOSAMAX) 70 MG tablet Take 70 mg by mouth every 7 (seven) days. Take with a full glass of water on an empty stomach. Take on Mondays    [provider]  magnesium hydroxide (MILK OF MAGNESIA) 400 MG/5ML suspension Take 30 mLs by mouth 2 (two) times daily as needed for mild constipation.    [provider]   No Known Allergies Review of Systems  Unable to perform ROS: Acuity of condition    Physical Exam Vitals and nursing note reviewed. Exam conducted with a chaperone present.  Constitutional:      Appearance: She is ill-appearing.     Comments: frail  Pulmonary:     Comments: Increased WOB Skin:    General: Skin is dry.     Coloration: Skin is pale.  Neurological:     Comments: Unresponsive to my voice or touch     Vital Signs: BP 113/62 (BP Location: Right Arm)   Pulse 85   Temp 98.7 F (37.1 C) (Oral)   Resp 15   Ht 5\' 2"  (1.575 m)   Wt 45.4 kg   SpO2 (!) 87%   BMI 18.29 kg/m  Pain Scale: 0-10   Pain Score: 0-No pain   SpO2: SpO2: (!) 87 % O2  Device:SpO2: (!) 87 % O2 Flow Rate: .O2 Flow Rate (L/min): 30 L/min  IO: Intake/output summary:   Intake/Output Summary (Last 24 hours) at 08/25/2019 1416 Last data filed at 08/25/2019 0600 Gross per 24 hour  Intake 2700 ml  Output 701 ml  Net 1999 ml    LBM: Last BM Date: 08/25/19 Baseline Weight: Weight: 45.4 kg Most recent weight: Weight: 45.4 kg     Palliative Assessment/Data: PPS: 10%     Thank you for this consult. Palliative medicine will continue to follow and assist as needed.   Time In: 1330 Time Out: 1445 Time Total: 75 mins Greater than 50%  of this time was spent counseling and coordinating care related to the above assessment and plan.  Signed by: Mariana Kaufman, AGNP-C Palliative Medicine    Please contact Palliative Medicine Team phone at [phone] for questions and concerns.  For individual provider: See Shea Evans

## 2019-08-25 NOTE — Progress Notes (Signed)
Patient 84% on 15L HFNC.  Initiation of 15L NRB on top of HFNC done at this time. Pt's oxygen slowly improved to 87%.  Attempted to prone pt, but d/t kyphosis pt could not get comfortable on her stomach. No urine OP this shift.  Pt slide up in bed and turned on her R side.  Oxygen increased to 100%.

## 2019-08-25 NOTE — Progress Notes (Signed)
Patient on 15 lpm high flow nasal cannula , Saturation around 90 f about 24 and labored. Breath sounds with rhonchi and wheezes, congested cough. Patient is still alert and oriented. Also hard of hearing.

## 2019-08-25 NOTE — Plan of Care (Signed)
  Problem: Health Behavior/Discharge Planning: Goal: Ability to manage health-related needs will improve Outcome: Not Progressing   Problem: Clinical Measurements: Goal: Ability to maintain clinical measurements within normal limits will improve Outcome: Not Progressing   Problem: Clinical Measurements: Goal: Diagnostic test results will improve Outcome: Not Progressing

## 2019-08-25 NOTE — Progress Notes (Signed)
Patient does not respond to verbal stimuli and is less responsive to physical stimuli. Patient is using accessory muscle with breathing and O2 sat 88% on 15L HFNC. Patient placed on 15L NRB PRN morphine given. Patient is pale and lower extremities are cold to touch.

## 2019-08-25 NOTE — Progress Notes (Signed)
PROGRESS NOTE    Jaclyn Simpson  [id number] DOB: 21-Feb-1917 DOA:  PCP: System, Pcp Not In     Brief Narrative:  85 y.o. female with PMH significant for HTN, prior hx of stroke, HLD, CKD stage 3b, GERD and hypothyroidism; who came to ED from her SNF secondary to increase fatigue, general malaise and SOB. Patient found with mild hypoxia (O2 sat's in mid 80's at [facility]. Patient reported anorexia and general malaise. Denies CP, nausea, vomiting, dysuria, hematuria, melena, hematochezia, focal weakness or any other complaints. In ED found to be mildly dehydrated, with decrease skin turgor and dry MM. Blood work demonstrated acute on Chronic renal failure and positive COVID-19. CXR with bibasilar atelectasis.  IVF's initiated, O2 supplementation In place (3-4L [place]), inflammatory markers ordered, steroids and remdesivir given and TRH called to admit patient for further evaluation and management.    Assessment & Plan: 1-acute hypoxemic respiratory failure due to COVID-19 -Condition has worsened the patient is more short of breath. -Requiring high flow nasal cannula supplementation over 15 L with just barely reaching 88-90% -Patient is afebrile. -After discussing with family members they feel no BiPAP, no pressure, no heroic measures -Will restart morphine 2 mg every 2 hours as needed to assist with air hunger -Continue oxygen supplementation -One-time dose of Lasix will be given in case that there is a component of fluid overload -Palliative care service has been consulted to assist with comfort management and transition to end-of-life care if the patient continued to decompensate. -Prognosis is very guarded in hospital death anticipated. -Patient is DNR/DNI.  2-acute on chronic renal failure -Creatinine improved after IV fluids given -Continue to minimize nephrotoxic agents.  3-hypothyroidism -will hold on synthroid currently  -patient with difficulties taking PO med.s    4-GERD -continue PPI  5-constipation -chronic -continue PRN laxatives.  6-DNR/DNI -will respect wishes and aim for comfort as main goal.  7-HTN -patient BP is soft  -Continue holding antihypertensive medication.   DVT prophylaxis: Lovenox Code Status: DNR/DNI Family Communication: niece over the phone.  Disposition Plan: Long discussion with family members, at this particular moment no invasive/heroic intervention will be attentive.  Patient is DNR/DNI will focus on comfort care.  No use of pressors, no BiPAP.  Patient is now having difficulty taking medication by mouth and will minimize undesired treatment.  Consultants:   Palliative care  Procedures:   See below for x-ray reports.  Antimicrobials:  Anti-infectives (From admission, onward)   Start     Dose/Rate Route Frequency Ordered Stop   08/25/19 1000  remdesivir 100 mg in sodium chloride 0.9 % 100 mL IVPB     100 mg 200 mL/hr over 30 Minutes Intravenous Daily 09/07/2019 1455 08/29/19 0959   08/23/2019 1500  remdesivir 200 mg in sodium chloride 0.9% 250 mL IVPB     200 mg 580 mL/hr over 30 Minutes Intravenous Once 08/14/2019 1455 09/01/2019 2104       Subjective: Worsening respiratory distress overnight; requiring high flow nasal cannula supplementation still struggling to catch her breath.  No fever, no chest pain, no nausea, no vomiting.  Supple pressure appreciated.  Tachypneic.  Oriented x2.  Very hard of hearing.  Objective: Vitals:   08/25/19 0215 08/25/19 0502 08/25/19 1002 08/25/19 1313  BP:  (!) 114/58  113/62  Pulse:  94  85  Resp:  (!) 24 (!) 30 15  Temp:  97.7 F (36.5 C)  98.7 F (37.1 C)  TempSrc:  Oral  Oral  SpO2: 91%  90% (!) 87% (!) 87%  Weight:      Height:        Intake/Output Summary (Last 24 hours) at 08/25/2019 1322 Last data filed at 08/25/2019 0600 Gross per 24 hour  Intake 2700 ml  Output 701 ml  Net 1999 ml   Filed Weights   08/19/2019 1208  Weight: 45.4 kg     Examination: General exam: Alert, awake, oriented x 2; afebrile, no chest pain.  Increased work of breathing, having difficulty catching her breath or speaking in full sentences.  Using high flow nasal cannula supplementation over 15 L O2 sats 88 to 90%. Respiratory system: Positive crackles at the bases, diffuse rhonchi bilaterally, using accessory muscles. Cardiovascular system: Soft systolic ejection murmur, no rubs, no gallops, regular rate and rhythm. Gastrointestinal system: Abdomen is nondistended, soft and nontender. No organomegaly or masses felt. Normal bowel sounds heard. Central nervous system: Alert and oriented. No focal neurological deficits. Extremities: No cyanosis or clubbing; trace edema appreciated Skin: No rashes, no petechiae. Psychiatry: Mood & affect appropriate. Patient restless and uncomfortable.    Data Reviewed: I have personally reviewed following labs and imaging studies  CBC: Recent Labs  Lab 09/08/2019 1253 08/22/2019 1513 08/25/19 0446  WBC 6.2 6.6 11.3*  NEUTROABS 5.2  --  9.1*  HGB 9.8* 10.1* 11.9*  HCT 33.2* 35.3* 43.1  MCV 85.1 86.3 90.2  PLT 217 216 123XX123   Basic Metabolic Panel: Recent Labs  Lab 09/08/2019 1253 08/20/2019 1513 08/25/19 0446  NA 141 141 143  K 3.9 3.8 4.5  CL 101 103 105  CO2 26 25 23   GLUCOSE 112* 109* 141*  BUN 43* 41* 43*  CREATININE 1.33* 1.19* 1.11*  CALCIUM 8.3* 7.8* 8.3*  MG  --   --  2.4  PHOS  --   --  6.4*   GFR: Estimated Creatinine Clearance: 19.3 mL/min (A) (by C-G formula based on SCr of 1.11 mg/dL (H)).   Liver Function Tests: Recent Labs  Lab 09/10/2019 1513 08/25/19 0446  AST 46* 66*  ALT 19 26  ALKPHOS 63 68  BILITOT 0.4 0.5  PROT 6.1* 6.5  ALBUMIN 3.2* 3.3*   Anemia Panel: Recent Labs    08/25/19 0446  FERRITIN 23   Urine analysis:    Component Value Date/Time   COLORURINE YELLOW 08/17/2019 [place] 08/23/2019 1604   LABSPEC 1.014 08/29/2019 1604   PHURINE 5.0  08/27/2019 [place] 09/03/2019 1604   HGBUR NEGATIVE 09/08/2019 1604   BILIRUBINUR NEGATIVE 09/06/2019 1604   KETONESUR NEGATIVE 09/05/2019 1604   PROTEINUR NEGATIVE 08/25/2019 1604   NITRITE NEGATIVE 08/22/2019 1604   LEUKOCYTESUR NEGATIVE 08/23/2019 1604    Radiology Studies: DG Chest 1 View  Result Date: 08/18/2019 CLINICAL DATA:  Shortness of breath. EXAM: CHEST  1 VIEW COMPARISON:  November 07, 2018. FINDINGS: Stable cardiomegaly. Atherosclerosis of thoracic aorta is noted. No pneumothorax is noted. Mild bibasilar atelectasis is noted with probable small pleural effusions. Bony thorax is unremarkable. IMPRESSION: 1. Aortic atherosclerosis. Mild bibasilar subsegmental atelectasis with probable small pleural effusions. 2. No pneumothorax is noted. Electronically Signed   By: Marijo Conception M.D.   On: 08/17/2019 13:35    Scheduled Meds: . enoxaparin (LOVENOX) injection  30 mg Subcutaneous Q24H  . methylPREDNISolone (SOLU-MEDROL) injection  0.5 mg/kg Intravenous Q12H  .  morphine injection  1 mg Intravenous Once  . pantoprazole  40 mg Oral Daily  . scopolamine  1 patch Transdermal  Q72H   Continuous Infusions: . remdesivir 100 mg in NS 100 mL 100 mg (08/25/19 1004)     LOS: 1 day    Time spent: 35 minutes. Greater than 50% of this time was spent in direct contact with the patient, coordinating care and discussing relevant ongoing clinical issues, including discussion about all acute hypoxemic respiratory failure in the setting of Covid 19 pneumonia infection.  Patient drastically struggling at this moment requiring 15 L high flow nasal cannula supplementation and is still having desaturation and significant work of breathing.  Extensive discussion with family members feels appropriate to focus on comfort care and symptomatic management.     Barton Dubois, MD [facility] Pager [phone]  If 7PM-7AM, please contact  night-coverage [email or web address] Password [email or web address] 08/25/2019, 1:22 PM

## 2019-08-25 NOTE — Progress Notes (Signed)
Patient admitted to 333.  Endorses being short of breath and feeling unable to catch her breath. Increased O2 to 15L.

## 2019-08-25 NOTE — Progress Notes (Signed)
Patient less alert.  Will wake up to stimulation, but falls back asleep.  Still has tachypnea, and dyspnea with accessory muscle use.  Oxygen ranging from 87-92% on 15L HFNC and 15L NRB.  Patients color is pale and ashen.  BLE are mottled from knees down.  Foley placed with return of 700 mls.  Emotional support provided to the patient who kept stating "my breathing isn't right" and "I can't catch my air". One time dose of 1mg  morphine given to help with air hunger.

## 2019-08-26 DIAGNOSIS — J069 Acute upper respiratory infection, unspecified: Secondary | ICD-10-CM

## 2019-08-26 DIAGNOSIS — R0602 Shortness of breath: Secondary | ICD-10-CM

## 2019-08-26 DIAGNOSIS — Z515 Encounter for palliative care: Secondary | ICD-10-CM

## 2019-08-26 MED ORDER — MORPHINE BOLUS VIA INFUSION
2.0000 mg | INTRAVENOUS | Status: DC | PRN
  Administered 2019-08-26: 2 mg via INTRAVENOUS
  Filled 2019-08-26: qty 2

## 2019-08-26 MED ORDER — MORPHINE 100MG IN NS 100ML (1MG/ML) PREMIX INFUSION
1.0000 mg/h | INTRAVENOUS | Status: DC
  Administered 2019-08-26: 1 mg/h via INTRAVENOUS
  Filled 2019-08-26: qty 100

## 2019-08-26 MED ORDER — MORPHINE SULFATE (PF) 2 MG/ML IV SOLN
2.0000 mg | INTRAVENOUS | Status: AC
  Administered 2019-08-26: 2 mg via INTRAVENOUS
  Filled 2019-08-26: qty 1

## 2019-09-12 NOTE — Discharge Summary (Signed)
Death Summary  Jaclyn Simpson [id number] DOB: Dec 23, 1916 DOA: 2019-09-18  PCP: System, Pcp Not In PCP/Office notified: no PCP listed on Epic.  Admit date: Sep 18, 2019 Date of Death: 09-20-2019  Final Diagnoses:  Active Problems:   DNR (do not resuscitate)   Acute respiratory disease due to COVID-19 virus   Acute respiratory failure due to COVID-19 [facility])   Acute respiratory failure ([place])   COVID-19   Comfort measures only status   Palliative care by specialist   Goals of care, counseling/discussion   Advanced care planning/counseling discussion   SOB (shortness of breath)   Terminal care   History of present illness:  86 y.o.femalewith PMH significant for HTN, prior hx of stroke, HLD, CKD stage 3b, GERD and hypothyroidism; who came to ED from her SNF secondary to increase fatigue, general malaise and SOB. Patient found with mild hypoxia (O2 sat's in mid 80's at [facility]. Patient reported anorexia and general malaise. Denies CP, nausea, vomiting, dysuria, hematuria, melena, hematochezia, focal weakness or any other complaints. In ED found to be mildly dehydrated, with decrease skin turgor and dry MM. Blood work demonstrated acute on Chronic renal failure and positive COVID-19. CXR with bibasilar atelectasis.  IVF's initiated, O2 supplementation In place (3-4L [place]), inflammatory markers ordered, steroids and remdesivir given and TRH called to admit patient for further evaluation and management.  Hospital Course:  1-acute hypoxemic respiratory failure due to COVID-19 -Condition worsened despite IV, steroids, remdesevir, bronchodilators, IVF's and supportive care. Patient ended requiring high flow nasal cannula supplementation over 15 L with just barely reaching 87-88% -Patient was afebrile. -After discussing with family members they expressed desire to not pursuit BiPAP, no pressors, no heroic/invasive measures. -kept on oxygen supplementation for comfort. -Appreciate assistance by  palliative care; patient care transitioned to symptomatic management and end-of-life comfort care.  Morphine drip initiated on September 20, 2019, patient comfortably expired at 16:30. [facility] death was anticipated. -Patient was DNR/DNI.  2-acute on chronic renal failure -Creatinine improved after IV fluids given -once transition to full comfort care, no further labs drawn. -End-of-life care and symptomatic management followed.  3-hypothyroidism -Patient was chronically on Synthroid; medication discontinued when patient is able to swallow oral meds. -once transition to full comfort care no more synthroid provided.   4-GERD -Patient kept on PPI.  5-constipation -chronic -PRN laxatives were used.  6-DNR/DNI -wishes respected and patient goals of care for comfort and symptomatic management maintained.  7-HTN -patient BP was soft  -Antihypertensive medications has been discontinue and the main goal is comfort care.    Time: 25 minutes.  Signed:  Barton Dubois  [facility] 09-20-19, 4:46 PM

## 2019-09-12 NOTE — Progress Notes (Signed)
Pt last respiration noted at 1630.  Death verified at 13 by myself and Tenna Delaine , RN with absence of respirations, absence of heart sounds and fixed pupils.

## 2019-09-12 NOTE — Progress Notes (Signed)
Palliative-   Called by patient's RN- patient had change in mental status and breathing pattern.  I went to patient's bedside and she is completely unresponsive. Appears comfortable. Breathing with some presence of apnea at times.  O2 decreased to 2L as I do not think this is making a difference in her comfort at this point.   I attempted to call patient's HCPOA to update. Left message requesting return call.   Mariana Kaufman, AGNP-C Palliative Medicine  Please call Palliative Medicine team phone with any questions [phone]. For individual providers please see AMION.  No charge

## 2019-09-12 NOTE — Progress Notes (Signed)
[facility] contacted per policy, not suitable for donation due to age. Reference [phone], contact person Murlean Caller.  Post-mortem care provided. AC and charge nurse aware of patient's death. Pt's belongings placed with patient in body bag. (see flowsheet for specific items)

## 2019-09-12 NOTE — Progress Notes (Signed)
Security brought gurney up to unit. Patients personal belongings packed up and place inside body bag with patient. Patient placed on gurney, aide and security took expired patient down stairs to meet funeral home staff.

## 2019-09-12 NOTE — Progress Notes (Signed)
87.3 ml of Morphine drip (100mg /180ml) wasted in sink by Alwyn Ren, RN and amount verified and waste witnessed by Deirdre Pippins, RN.

## 2019-09-12 NOTE — Progress Notes (Signed)
Pt's responsiveness has decreased over the past 2 hours. Previously was responsive to name called, now moans a little only with tactile stimulation. Central Tele reported 25 beat run of SVT. Orders noted to discontinue cardiac monitoring and this was done. Resp 25/min with 10 second periods of apnea. Using upper body muscles with each inspiration, occasional facial grimacing. 2mg  bolus of Morphine given per order.

## 2019-09-12 NOTE — Progress Notes (Signed)
PROGRESS NOTE    Jaclyn Simpson  [id number] DOB: June 18, 1917 DOA: 08/25/2019 PCP: System, Pcp Not In     Brief Narrative:  86 y.o. female with PMH significant for HTN, prior hx of stroke, HLD, CKD stage 3b, GERD and hypothyroidism; who came to ED from her SNF secondary to increase fatigue, general malaise and SOB. Patient found with mild hypoxia (O2 sat's in mid 80's at [facility]. Patient reported anorexia and general malaise. Denies CP, nausea, vomiting, dysuria, hematuria, melena, hematochezia, focal weakness or any other complaints. In ED found to be mildly dehydrated, with decrease skin turgor and dry MM. Blood work demonstrated acute on Chronic renal failure and positive COVID-19. CXR with bibasilar atelectasis.  IVF's initiated, O2 supplementation In place (3-4L [place]), inflammatory markers ordered, steroids and remdesivir given and TRH called to admit patient for further evaluation and management.    Assessment & Plan: 1-acute hypoxemic respiratory failure due to COVID-19 -Condition has worsened the patient is more short of breath. -Requiring high flow nasal cannula supplementation over 15 L with just barely reaching 87-88% -Patient is afebrile. -After discussing with family members they feel no BiPAP, no pressure, no heroic measures -Continue oxygen supplementation -Appreciate assistance by palliative care; continue symptomatic management and end-of-life care.  Morphine drip to be initiated. [facility] day of anticipated. -Patient is DNR/DNI.  2-acute on chronic renal failure -Creatinine improved after IV fluids given -No further labs will be drawn -Full comfort care and symptomatic management.  3-hypothyroidism -Patient having some difficulty taking oral medications, goals of care intention symptomatic management and comfort. -Synthroid discontinued.  4-GERD -continue PPI  5-constipation -chronic -continue PRN laxatives.  6-DNR/DNI -will respect wishes and aim for  comfort as main goal.  7-HTN -patient BP is soft  -Antihypertensive medications has been discontinue and the main goal is comfort care.   DVT prophylaxis: Lovenox Code Status: DNR/DNI Family Communication: niece over the phone.  Disposition Plan: Long discussion with family members, at this particular moment no invasive/heroic intervention will be attentive.  Patient is DNR/DNI will focus on comfort care.  Appreciate assistance by palliative care.  Hospital death anticipated.  Patient will be started on morphine drip.  Consultants:   Palliative care  Procedures:   See below for x-ray reports.  Antimicrobials:  Anti-infectives (From admission, onward)   Start     Dose/Rate Route Frequency Ordered Stop   08/25/19 1000  remdesivir 100 mg in sodium chloride 0.9 % 100 mL IVPB  Status:  Discontinued     100 mg 200 mL/hr over 30 Minutes Intravenous Daily 08/14/2019 1455 08/25/19 1413   08/13/2019 1500  remdesivir 200 mg in sodium chloride 0.9% 250 mL IVPB     200 mg 580 mL/hr over 30 Minutes Intravenous Once 08/14/2019 1455 08/22/2019 2104       Subjective: Patient continued to demonstrate labored breathing, mild distress and appears to be uncomfortable and restless.  Currently afebrile.  High flow nasal cannula supplementation in place.  Positive tachypnea.  Objective: Vitals:   08/25/19 1002 08/25/19 1313 08/25/19 1942 2019/09/16 0830  BP:  113/62    Pulse:  85    Resp: (!) 30 15  20   Temp:  98.7 F (37.1 C)    TempSrc:  Oral    SpO2: (!) 87% (!) 87% (!) 84%   Weight:      Height:        Intake/Output Summary (Last 24 hours) at 09-16-19 1306 Last data filed at 09/16/2019 0500 Gross per 24  hour  Intake --  Output 350 ml  Net -350 ml   Filed Weights   08/25/2019 1208  Weight: 45.4 kg    Examination: General exam: Alert, awake, oriented x 2; appears to be in distress, with labored breathing and using accessory muscles.  No fever. Respiratory system: Diffuse rhonchi,  increased respiratory rate, using accessory muscles.  High flow nasal cannula supplementation for comfort. Cardiovascular system:RRR.  Soft systolic murmur, no rubs, no gallops. Gastrointestinal system: Abdomen is nondistended, soft and nontender. No organomegaly or masses felt. Normal bowel sounds heard. Central nervous system: Alert and oriented. No focal neurological deficits. Extremities: No cyanosis or clubbing; trace-1+ edema bilaterally. Skin: No rashes, no petechiae. Psychiatry: Mood & affect appropriate.  Appears to be restless.    Data Reviewed: I have personally reviewed following labs and imaging studies  CBC: Recent Labs  Lab 08/14/2019 1253 08/25/19 0446  WBC 6.2 11.3*  NEUTROABS 5.2 9.1*  HGB 9.8* 11.9*  HCT 33.2* 43.1  MCV 85.1 90.2  PLT 217 [id number]   Basic Metabolic Panel: Recent Labs  Lab 08/20/2019 1253 08/27/2019 1513 08/25/19 0446  NA 141 141 143  K 3.9 3.8 4.5  CL 101 103 105  CO2 26 25 23   GLUCOSE 112* 109* 141*  BUN 43* 41* 43*  CREATININE 1.33* 1.19* 1.11*  CALCIUM 8.3* 7.8* 8.3*  MG  --   --  2.4  PHOS  --   --  6.4*   GFR: Estimated Creatinine Clearance: 19.3 mL/min (A) (by C-G formula based on SCr of 1.11 mg/dL (H)).   Liver Function Tests: Recent Labs  Lab 08/17/2019 1513 08/25/19 0446  AST 46* 66*  ALT 19 26  ALKPHOS 63 68  BILITOT 0.4 0.5  PROT 6.1* 6.5  ALBUMIN 3.2* 3.3*   Anemia Panel: Recent Labs    08/25/19 0446  FERRITIN 23   Urine analysis:    Component Value Date/Time   COLORURINE YELLOW 09/03/2019 [place] 08/16/2019 1604   LABSPEC 1.014 08/16/2019 1604   PHURINE 5.0 08/22/2019 [place] 08/25/2019 1604   HGBUR NEGATIVE 09/03/2019 1604   BILIRUBINUR NEGATIVE 08/13/2019 1604   KETONESUR NEGATIVE 09/05/2019 1604   PROTEINUR NEGATIVE 08/12/2019 1604   NITRITE NEGATIVE 09/02/2019 1604   LEUKOCYTESUR NEGATIVE 09/02/2019 1604    Radiology Studies: DG Chest 1 View  Result Date:  08/21/2019 CLINICAL DATA:  Shortness of breath. EXAM: CHEST  1 VIEW COMPARISON:  November 07, 2018. FINDINGS: Stable cardiomegaly. Atherosclerosis of thoracic aorta is noted. No pneumothorax is noted. Mild bibasilar atelectasis is noted with probable small pleural effusions. Bony thorax is unremarkable. IMPRESSION: 1. Aortic atherosclerosis. Mild bibasilar subsegmental atelectasis with probable small pleural effusions. 2. No pneumothorax is noted. Electronically Signed   By: Marijo Conception M.D.   On: 08/14/2019 13:35    Scheduled Meds: .  morphine injection  1 mg Intravenous Once  . scopolamine  1 patch Transdermal Q72H   Continuous Infusions: . morphine 1 mg/hr (09/22/2019 1154)     LOS: 2 days    Time spent: 35 minutes.    Barton Dubois, MD [facility] Pager [phone]   Sep 22, 2019, 1:06 PM

## 2019-09-12 NOTE — Progress Notes (Signed)
Daily Progress Note   Patient Name: Jaclyn Simpson       Date: 30-Aug-2019 DOB: Sep 14, 1916  Age: 86 y.o. MRN#: [id number] Attending Physician: Barton Dubois, MD Primary Care Physician: System, Pcp Not In Admit Date: 08/12/2019  Reason for Consultation/Follow-up: Establishing goals of care  Subjective: Patient in bed. Breathing is labored- using upper body to breathe, increased respiration rate. Discussed comfort care measures with patient's RN.   Review of Systems  Unable to perform ROS: Acuity of condition    Length of Stay: 2  Current Medications: Scheduled Meds:  .  morphine injection  1 mg Intravenous Once  .  morphine injection  2 mg Intravenous NOW  . scopolamine  1 patch Transdermal Q72H    Continuous Infusions: . morphine      PRN Meds: acetaminophen, antiseptic oral rinse, glycopyrrolate **OR** glycopyrrolate **OR** glycopyrrolate, haloperidol **OR** haloperidol **OR** haloperidol lactate, LORazepam **OR** [DISCONTINUED] LORazepam **OR** LORazepam, morphine, ondansetron **OR** ondansetron (ZOFRAN) IV, polyvinyl alcohol  Physical Exam Vitals and nursing note reviewed.  Constitutional:      Appearance: She is ill-appearing.  Cardiovascular:     Rate and Rhythm: Tachycardia present.     Pulses: Normal pulses.  Pulmonary:     Comments: Increased WOB, increased rate            Vital Signs: BP 113/62 (BP Location: Right Arm)   Pulse 85   Temp 98.7 F (37.1 C) (Oral)   Resp 15   Ht 5\' 2"  (1.575 m)   Wt 45.4 kg   SpO2 (!) 84%   BMI 18.29 kg/m  SpO2: SpO2: (!) 84 % O2 Device: O2 Device: High Flow Nasal Cannula O2 Flow Rate: O2 Flow Rate (L/min): 15 L/min  Intake/output summary:   Intake/Output Summary (Last 24 hours) at August 30, 2019 1117 Last data filed  at August 30, 2019 0500 Gross per 24 hour  Intake --  Output 350 ml  Net -350 ml   LBM: Last BM Date: 08/25/19 Baseline Weight: Weight: 45.4 kg Most recent weight: Weight: 45.4 kg       Palliative Assessment/Data: PPS: 10%      Patient Active Problem List   Diagnosis Date Noted  . COVID-19   . Comfort measures only status   . Palliative care by specialist   . Goals of care, counseling/discussion   .  Advanced care planning/counseling discussion   . Acute respiratory disease due to COVID-19 virus 08/19/2019  . Acute respiratory failure due to COVID-19 ([place]) 08/28/2019  . Acute respiratory failure ([place]) 08/14/2019  . Generalized weakness 11/07/2018  . Dehydration 10/19/2015  . Hypokalemia 10/19/2015  . Hypochloremic alkalosis 10/19/2015  . DNR (do not resuscitate) 10/19/2015  . Acute urinary retention   . Altered mental status   . Hypermagnesemia   . Hyponatremia   . Pelvic mass in female 12/22/2013  . 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 Metatarsal fracture 01/01/2013  . Chronic kidney disease 08/01/2011  . Hyperlipidemia 08/01/2011  . Gastroesophageal reflux disease 08/01/2011  . Osteoporosis 08/01/2011  . Hypothyroid 08/01/2011  . Laboratory test 08/01/2011  . Spinal stenosis   . Edema extremities 07/21/2011  . Syncope   . Aortic valve stenosis   . Cerebrovascular accident [facility]) 09/30/2009    Palliative Care Assessment & Plan   Patient Profile: 86 y.o. female  with past medical history of HTN, stroke, HLD, CKD 3b, hypothyroidism admitted on 08/23/2019 from SNF with fatigue, malaise, SOB, anorexia. Workup revealed respiratory failure in the setting of COVID-19. She was started on remdesivir and steroids with limits set at DNR, no Bipap, no pressors. This morning she had worsening change in mental status and respiratory status- Palliative medicine consulted for [place].   Assessment/Recommendations/Plan   Increased WOB- will start morphine infusion  1mg /hr, with 2mg  prn q30 min bolus   Discussed comfort care measures with RN  Called and updated [name]- patient's HCPOA- she plans to visit today  Goals of Care and Additional Recommendations:  Limitations on Scope of Treatment: Full Comfort Care  Code Status:  DNR  Prognosis:   Hours - Days  Discharge Planning:  Anticipated Hospital Death  Care plan was discussed with Dr. Dyann Kief, patient's nurse, and patient's [place]  Thank you for allowing the Palliative Medicine Team to assist in the care of this patient.   Time In: 1000 Time Out: 1035 Total Time 35 mins Prolonged Time Billed   no      Greater than 50%  of this time was spent counseling and coordinating care related to the above assessment and plan.  Mariana Kaufman, AGNP-C Palliative Medicine   Please contact Palliative Medicine Team phone at [phone] for questions and concerns.

## 2019-09-12 NOTE — Progress Notes (Signed)
Pt's respirations 8/min with 20 second periods of apnea. Pt with gasping respirations, Morphine 2mg  IV given per order for s/s air hunger. Pt remains unresponsive to all stimuli. Extremities cooler than earlier this afternoon, nail beds cyanotic, slight mottling of lower legs. Palliative care NP notified of pt's current condition, states she has notified family of pt's condition earlier this afternoon and family is aware of pt's nearing death.

## 2019-09-12 DEATH — deceased

## 2020-02-10 DEATH — deceased
